# Patient Record
Sex: Female | Born: 1950 | Race: White | Hispanic: No | Marital: Married | Smoking: Former smoker
Health system: Southern US, Community
[De-identification: ages and names within clinical notes are randomized; demographics above are authoritative.]

## PROBLEM LIST (undated history)

## (undated) DIAGNOSIS — R011 Cardiac murmur, unspecified: Secondary | ICD-10-CM

## (undated) DIAGNOSIS — K760 Fatty (change of) liver, not elsewhere classified: Secondary | ICD-10-CM

## (undated) DIAGNOSIS — M797 Fibromyalgia: Secondary | ICD-10-CM

## (undated) DIAGNOSIS — D649 Anemia, unspecified: Secondary | ICD-10-CM

## (undated) DIAGNOSIS — M1990 Unspecified osteoarthritis, unspecified site: Secondary | ICD-10-CM

## (undated) DIAGNOSIS — R519 Headache, unspecified: Secondary | ICD-10-CM

## (undated) DIAGNOSIS — E119 Type 2 diabetes mellitus without complications: Secondary | ICD-10-CM

## (undated) DIAGNOSIS — K219 Gastro-esophageal reflux disease without esophagitis: Secondary | ICD-10-CM

## (undated) HISTORY — PX: TONSILLECTOMY: SUR1361

---
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
## 1981-08-30 HISTORY — PX: TUBAL LIGATION: SHX77

## 2008-08-30 DIAGNOSIS — C801 Malignant (primary) neoplasm, unspecified: Secondary | ICD-10-CM

## 2008-08-30 HISTORY — DX: Malignant (primary) neoplasm, unspecified: C80.1

## 2008-08-30 HISTORY — PX: MASTECTOMY: SHX3

## 2010-05-22 ENCOUNTER — Encounter: Admission: RE | Admit: 2010-05-22 | Discharge: 2010-06-03 | Payer: Self-pay | Admitting: Internal Medicine

## 2010-06-03 ENCOUNTER — Encounter
Admission: RE | Admit: 2010-06-03 | Discharge: 2010-08-25 | Payer: Self-pay | Source: Home / Self Care | Attending: Internal Medicine | Admitting: Internal Medicine

## 2014-03-07 DIAGNOSIS — F418 Other specified anxiety disorders: Secondary | ICD-10-CM | POA: Insufficient documentation

## 2015-08-31 HISTORY — PX: EYE SURGERY: SHX253

## 2018-04-10 DIAGNOSIS — M47812 Spondylosis without myelopathy or radiculopathy, cervical region: Secondary | ICD-10-CM | POA: Insufficient documentation

## 2019-07-17 ENCOUNTER — Other Ambulatory Visit: Payer: Self-pay | Admitting: Neurological Surgery

## 2019-08-13 NOTE — Pre-Procedure Instructions (Signed)
Cameron Dolle  08/13/2019      [facility], [place] [place] [address] Phone: [phone] Fax: [phone]    Your procedure is scheduled on August 17, 2019.  Report to [facility] Entrance "A" at 530 AM.  Call this number if you have problems the morning of surgery:  [phone]   Call [phone] if you have any questions prior to your surgery date Monday-Friday 8am-4pm    Remember:  Do not eat or drink after midnight.    Take these medicines the morning of surgery with A SIP OF WATER  Atenolol (Tenormin) Escitalopram (lexapro) Gabapentin (neurontin) Pantoprazole (protonix)  Beginning now, STOP taking any Aspirin (unless otherwise instructed by your surgeon), Aleve, Naproxen, Ibuprofen, Motrin, Advil, Goody's, BC's, all herbal medications, fish oil, and all vitamins.   WHAT DO I DO ABOUT MY DIABETES MEDICATION?  Marland Kitchen Do not take oral diabetes medicines (pills) the morning of surgery- glimepiride (Amaryl) or metformin (Glucophage).  Do not take take afternoon/evening dose of glimepiride (amaryl) the day before surgery. Take morning dose only  Reviewed and Endorsed by [facility] Patient Education Committee, August 2015  How to Manage Your Diabetes Before and After Surgery  Why is it important to control my blood sugar before and after surgery? . Improving blood sugar levels before and after surgery helps healing and can limit problems. . A way of improving blood sugar control is eating a healthy diet by: o  Eating less sugar and carbohydrates o  Increasing activity/exercise o  Talking with your doctor about reaching your blood sugar goals . High blood sugars (greater than 180 mg/dL) can raise your risk of infections and slow your recovery, so you will need to focus on controlling your diabetes during the weeks before surgery. . Make sure that the doctor who takes care of your diabetes knows  about your planned surgery including the date and location.  How do I manage my blood sugar before surgery? . Check your blood sugar at least 4 times a day, starting 2 days before surgery, to make sure that the level is not too high or low. o Check your blood sugar the morning of your surgery when you wake up and every 2 hours until you get to the Short Stay unit. . If your blood sugar is less than 70 mg/dL, you will need to treat for low blood sugar: o Do not take insulin. o Treat a low blood sugar (less than 70 mg/dL) with  cup of clear juice (cranberry or apple), 4 glucose tablets, OR glucose gel. Recheck blood sugar in 15 minutes after treatment (to make sure it is greater than 70 mg/dL). If your blood sugar is not greater than 70 mg/dL on recheck, call [phone] o  for further instructions. . Report your blood sugar to the short stay nurse when you get to Short Stay.  . If you are admitted to the hospital after surgery: o Your blood sugar will be checked by the staff and you will probably be given insulin after surgery (instead of oral diabetes medicines) to make sure you have good blood sugar levels. o The goal for blood sugar control after surgery is 80-180 mg/dL.   [facility]- Preparing For Surgery  Before surgery, you can play an important role. Because skin is not sterile, your skin needs to be as free of germs as possible. You can reduce the number of germs on your  skin by washing with CHG (chlorahexidine gluconate) Soap before surgery.  CHG is an antiseptic cleaner which kills germs and bonds with the skin to continue killing germs even after washing.    Oral Hygiene is also important to reduce your risk of infection.  Remember - BRUSH YOUR TEETH THE MORNING OF SURGERY WITH YOUR REGULAR TOOTHPASTE  Please do not use if you have an allergy to CHG or antibacterial soaps. If your skin becomes reddened/irritated stop using the CHG.  Do not shave (including legs and underarms) for  at least 48 hours prior to first CHG shower. It is OK to shave your face.  Please follow these instructions carefully.   1. Shower the NIGHT BEFORE SURGERY and the MORNING OF SURGERY with CHG.   2. If you chose to wash your hair, wash your hair first as usual with your normal shampoo.  3. After you shampoo, rinse your hair and body thoroughly to remove the shampoo.  4. Use CHG as you would any other liquid soap. You can apply CHG directly to the skin and wash gently with a scrungie or a clean washcloth.   5. Apply the CHG Soap to your body ONLY FROM THE NECK DOWN.  Do not use on open wounds or open sores. Avoid contact with your eyes, ears, mouth and genitals (private parts). Wash Face and genitals (private parts)  with your normal soap.  6. Wash thoroughly, paying special attention to the area where your surgery will be performed.  7. Thoroughly rinse your body with warm water from the neck down.  8. DO NOT shower/wash with your normal soap after using and rinsing off the CHG Soap.  9. Pat yourself dry with a CLEAN TOWEL.  10. Wear CLEAN PAJAMAS to bed the night before surgery, wear comfortable clothes the morning of surgery  11. Place CLEAN SHEETS on your bed the night of your first shower and DO NOT SLEEP WITH PETS.  Day of Surgery: Shower as above Do not apply any deodorants/lotions.  Please wear clean clothes to the hospital/surgery center.   Remember to brush your teeth WITH YOUR REGULAR TOOTHPASTE.    Do not wear jewelry, make-up or nail polish.  Do not wear lotions, powders, or perfumes, or deodorant.  Do not shave 48 hours prior to surgery.    Do not bring valuables to the hospital.  [facility] is not responsible for any belongings or valuables.  IF you are a smoker, DO NOT Smoke 24 hours prior to surgery   IF you wear a CPAP at night please bring your mask, tubing, and machine the morning of surgery    Remember that you must have someone to transport you home  after your surgery, and remain with you for 24 hours if you are discharged the same day. Contacts, dentures or bridgework may not be worn into surgery.  Leave your suitcase in the car.  After surgery it may be brought to your room.  For patients admitted to the hospital, discharge time will be determined by your treatment team.  Patients discharged the day of surgery will not be allowed to drive home.   Please read over the following fact sheets that you were given.

## 2019-08-14 ENCOUNTER — Encounter (HOSPITAL_COMMUNITY)
Admission: RE | Admit: 2019-08-14 | Discharge: 2019-08-14 | Disposition: A | Discharge status: Home / Self Care | Payer: Medicare Other | Source: Ambulatory Visit | Attending: Neurological Surgery | Admitting: Neurological Surgery

## 2019-08-14 ENCOUNTER — Ambulatory Visit (HOSPITAL_COMMUNITY)
Admission: RE | Admit: 2019-08-14 | Discharge: 2019-08-14 | Disposition: A | Discharge status: Home / Self Care | Payer: Medicare Other | Source: Ambulatory Visit | Attending: Neurological Surgery | Admitting: Neurological Surgery

## 2019-08-14 ENCOUNTER — Encounter (HOSPITAL_COMMUNITY): Payer: Self-pay

## 2019-08-14 ENCOUNTER — Other Ambulatory Visit (HOSPITAL_COMMUNITY)
Admission: RE | Admit: 2019-08-14 | Discharge: 2019-08-14 | Disposition: A | Discharge status: Home / Self Care | Payer: Medicare Other | Source: Ambulatory Visit | Attending: Neurological Surgery | Admitting: Neurological Surgery

## 2019-08-14 ENCOUNTER — Other Ambulatory Visit: Payer: Self-pay

## 2019-08-14 ENCOUNTER — Encounter (HOSPITAL_COMMUNITY): Payer: Self-pay | Admitting: Physician Assistant

## 2019-08-14 DIAGNOSIS — Z01818 Encounter for other preprocedural examination: Secondary | ICD-10-CM | POA: Diagnosis not present

## 2019-08-14 DIAGNOSIS — U071 COVID-19: Secondary | ICD-10-CM | POA: Diagnosis not present

## 2019-08-14 DIAGNOSIS — Z01812 Encounter for preprocedural laboratory examination: Secondary | ICD-10-CM | POA: Diagnosis not present

## 2019-08-14 DIAGNOSIS — Z01810 Encounter for preprocedural cardiovascular examination: Secondary | ICD-10-CM | POA: Insufficient documentation

## 2019-08-14 DIAGNOSIS — M4310 Spondylolisthesis, site unspecified: Secondary | ICD-10-CM | POA: Insufficient documentation

## 2019-08-14 HISTORY — DX: Cardiac murmur, unspecified: R01.1

## 2019-08-14 HISTORY — DX: Type 2 diabetes mellitus without complications: E11.9

## 2019-08-14 HISTORY — DX: Unspecified osteoarthritis, unspecified site: M19.90

## 2019-08-14 HISTORY — DX: Gastro-esophageal reflux disease without esophagitis: K21.9

## 2019-08-14 HISTORY — DX: Fibromyalgia: M79.7

## 2019-08-14 LAB — HEMOGLOBIN A1C
Hgb A1c MFr Bld: 7 % — ABNORMAL HIGH (ref 4.8–5.6)
Mean Plasma Glucose: 154.2 mg/dL

## 2019-08-14 LAB — CBC WITH DIFFERENTIAL/PLATELET
Abs Immature Granulocytes: 0.03 10*3/uL (ref 0.00–0.07)
Basophils Absolute: 0 10*3/uL (ref 0.0–0.1)
Basophils Relative: 1 %
Eosinophils Absolute: 0.1 10*3/uL (ref 0.0–0.5)
Eosinophils Relative: 4 %
HCT: 44.4 % (ref 36.0–46.0)
Hemoglobin: 13.8 g/dL (ref 12.0–15.0)
Immature Granulocytes: 1 %
Lymphocytes Relative: 36 %
Lymphs Abs: 1.3 10*3/uL (ref 0.7–4.0)
MCH: 29.6 pg (ref 26.0–34.0)
MCHC: 31.1 g/dL (ref 30.0–36.0)
MCV: 95.1 fL (ref 80.0–100.0)
Monocytes Absolute: 0.6 10*3/uL (ref 0.1–1.0)
Monocytes Relative: 15 %
Neutro Abs: 1.6 10*3/uL — ABNORMAL LOW (ref 1.7–7.7)
Neutrophils Relative %: 43 %
Platelets: 214 10*3/uL (ref 150–400)
RBC: 4.67 MIL/uL (ref 3.87–5.11)
RDW: 14 % (ref 11.5–15.5)
WBC: 3.7 10*3/uL — ABNORMAL LOW (ref 4.0–10.5)
nRBC: 0 % (ref 0.0–0.2)

## 2019-08-14 LAB — BASIC METABOLIC PANEL
Anion gap: 9 (ref 5–15)
BUN: 7 mg/dL — ABNORMAL LOW (ref 8–23)
CO2: 25 mmol/L (ref 22–32)
Calcium: 9.2 mg/dL (ref 8.9–10.3)
Chloride: 104 mmol/L (ref 98–111)
Creatinine, Ser: 0.64 mg/dL (ref 0.44–1.00)
GFR calc Af Amer: >60 mL/min (ref >60)
GFR calc non Af Amer: >60 mL/min (ref >60)
Glucose, Bld: 52 mg/dL — ABNORMAL LOW (ref 70–99)
Potassium: 3.7 mmol/L (ref 3.5–5.1)
Sodium: 138 mmol/L (ref 135–145)

## 2019-08-14 LAB — PROTIME-INR
INR: 1 (ref 0.8–1.2)
Prothrombin Time: 13 seconds (ref 11.4–15.2)

## 2019-08-14 LAB — TYPE AND SCREEN
ABO/RH(D): O NEG
Antibody Screen: NEGATIVE

## 2019-08-14 LAB — SURGICAL PCR SCREEN
MRSA, PCR: NEGATIVE
Staphylococcus aureus: NEGATIVE

## 2019-08-14 LAB — GLUCOSE, CAPILLARY: Glucose-Capillary: 71 mg/dL (ref 70–99)

## 2019-08-14 LAB — ABO/RH: ABO/RH(D): O NEG

## 2019-08-14 NOTE — Progress Notes (Signed)
PCP - Elenor Quinones, MD Cardiologist - denies  Chest x-ray - 08/14/19 EKG - 08/14/19 Stress Test - 10/29/15 ECHO - denies Cardiac Cath - denies  Fasting Blood Sugar - 70-90s Checks Blood Sugar _____ times a day  Blood Thinner Instructions: N/A Aspirin Instructions: N/A  COVID TEST- scheduled after PAT today, 08/14/19  Coronavirus Screening  Have you experienced the following symptoms:  Cough yes/no: No Fever (>100.65F)  yes/no: No Runny nose yes/no: No Sore throat yes/no: No Difficulty breathing/shortness of breath  yes/no: No  Have you or a family member traveled in the last 14 days and where? yes/no: No  If the patient indicates "YES" to the above questions, their PAT will be rescheduled to limit the exposure to others and, the surgeon will be notified. THE PATIENT WILL NEED TO BE ASYMPTOMATIC FOR 14 DAYS.   If the patient is not experiencing any of these symptoms, the PAT nurse will instruct them to NOT bring anyone with them to their appointment since they may have these symptoms or traveled as well.   Please remind your patients and families that hospital visitation restrictions are in effect and the importance of the restrictions.   Anesthesia review: Yes; hx of heart murmur as a child, does not see a cardiologist; cardiac stress test in 2017; hx Breast CA  Patient denies shortness of breath, fever, cough and chest pain at PAT appointment   All instructions explained to the patient, with a verbal understanding of the material. Patient agrees to go over the instructions while at home for a better understanding. Patient also instructed to self quarantine after being tested for COVID-19. The opportunity to ask questions was provided.

## 2019-08-15 LAB — NOVEL CORONAVIRUS, NAA (HOSP ORDER, SEND-OUT TO REF LAB; TAT 18-24 HRS): SARS-CoV-2, NAA: DETECTED — AB

## 2019-08-15 NOTE — Anesthesia Preprocedure Evaluation (Deleted)
Anesthesia Evaluation    Airway        Dental   Pulmonary former smoker,           Cardiovascular      Neuro/Psych    GI/Hepatic   Endo/Other  diabetes  Renal/GU      Musculoskeletal   Abdominal   Peds  Hematology   Anesthesia Other Findings   Reproductive/Obstetrics                             Anesthesia Physical Anesthesia Plan  ASA:   Anesthesia Plan:    Post-op Pain Management:    Induction:   PONV Risk Score and Plan:   Airway Management Planned:   Additional Equipment:   Intra-op Plan:   Post-operative Plan:   Informed Consent:   Plan Discussed with:   Anesthesia Plan Comments: (Pt evaluated by cardiology at [facility] in 2017 for precordial pain. She had an exercise stress test that was normal. Pain felt to be possibly by GI related.   Pt reported history of childhood murmur, denies any cardiac hx, denies hx of CP, DOE, CAD. On exam heart is RRR no M/R/G. Lungs CTAB.   Preop labs reviewed, WNL. DMII well controlled A1c 7.0.  EKG 08/14/19: Normal sinus rhythm. Rate 84. Left axis deviation. Incomplete right bundle branch block  Exercise stress test 10/29/15: CONCLUSION: 1. No ECG changes to suggest ischemia during Exercise treadmill stress test 2. Exercise capacity 9.5 METs. 3. No chest pain reported. 4. Appropriate hemodynamic response to exercise)      Anesthesia Quick Evaluation

## 2019-08-15 NOTE — Progress Notes (Signed)
Anesthesia Chart Review: Pt evaluated by cardiology at [facility] in 2017 for precordial pain. She had an exercise stress test that was normal. Pain felt to be possibly by GI related.   Pt reported history of childhood murmur, denies any cardiac hx, denies hx of CP, DOE, CAD. On exam heart is RRR no M/R/G. Lungs CTAB.   Preop labs reviewed, WNL. DMII well controlled A1c 7.0.  EKG 08/14/19: Normal sinus rhythm. Rate 84. Left axis deviation. Incomplete right bundle branch block  Exercise stress test 10/29/15: CONCLUSION: 1. No ECG changes to suggest ischemia during Exercise treadmill stress test 2. Exercise capacity 9.5 METs. 3. No chest pain reported. 4. Appropriate hemodynamic response to exercise   Wynonia Musty [facility] Short Stay Center/Anesthesiology Phone [phone] 08/15/2019 10:45 AM

## 2019-08-16 ENCOUNTER — Other Ambulatory Visit: Payer: Self-pay | Admitting: Nurse Practitioner

## 2019-08-16 DIAGNOSIS — E119 Type 2 diabetes mellitus without complications: Secondary | ICD-10-CM

## 2019-08-16 DIAGNOSIS — U071 COVID-19: Secondary | ICD-10-CM

## 2019-08-16 NOTE — Progress Notes (Signed)
  I connected by phone with Margaret Moody on 08/16/2019 at 3:25 PM to discuss the potential use of an new treatment for mild to moderate COVID-19 viral infection in non-hospitalized patients.  This patient is a 68 y.o. female that meets the FDA criteria for Emergency Use Authorization of bamlanivimab or casirivimab\imdevimab.  Has a (+) direct SARS-CoV-2 viral test result  Has mild or moderate COVID-19   Is ? 66 years of age and weighs ? 40 kg  Is NOT hospitalized due to COVID-19  Is NOT requiring oxygen therapy or requiring an increase in baseline oxygen flow rate due to COVID-19  Is within 10 days of symptom onset  Has at least one of the high risk factor(s) for progression to severe COVID-19 and/or hospitalization as defined in EUA.  Specific high risk criteria : Diabetes Patient is over the age of 31 and currently being managed for diabetes.   I have spoken and communicated the following to the patient or parent/caregiver:  1. FDA has authorized the emergency use of bamlanivimab and casirivimab\imdevimab for the treatment of mild to moderate COVID-19 in adults and pediatric patients with positive results of direct SARS-CoV-2 viral testing who are 91 years of age and older weighing at least 40 kg, and who are at high risk for progressing to severe COVID-19 and/or hospitalization.  2. The significant known and potential risks and benefits of bamlanivimab and casirivimab\imdevimab, and the extent to which such potential risks and benefits are unknown.  3. Information on available alternative treatments and the risks and benefits of those alternatives, including clinical trials.  4. Patients treated with bamlanivimab and casirivimab\imdevimab should continue to self-isolate and use infection control measures (e.g., wear mask, isolate, social distance, avoid sharing personal items, clean and disinfect "high touch" surfaces, and frequent handwashing) according to CDC guidelines.   5. The  patient or parent/caregiver has the option to accept or refuse bamlanivimab or casirivimab\imdevimab .  After reviewing this information with the patient, The patient agreed to proceed with receiving the bamlanimivab infusion and will be provided a copy of the Fact sheet prior to receiving the infusion.Fenton Foy 08/16/2019 3:25 PM

## 2019-08-16 NOTE — Progress Notes (Signed)
Dr. Ronnald Ramp aware of patient's covid test results and states he will contact patient.

## 2019-08-17 ENCOUNTER — Inpatient Hospital Stay (HOSPITAL_COMMUNITY): Admission: RE | Admit: 2019-08-17 | Payer: Medicare Other | Source: Home / Self Care | Admitting: Neurological Surgery

## 2019-08-17 ENCOUNTER — Encounter (HOSPITAL_COMMUNITY): Admission: RE | Payer: Self-pay | Source: Home / Self Care

## 2019-08-17 SURGERY — POSTERIOR LUMBAR FUSION 1 LEVEL
Anesthesia: General | Site: Back

## 2019-08-20 ENCOUNTER — Ambulatory Visit (HOSPITAL_COMMUNITY)
Admission: RE | Admit: 2019-08-20 | Discharge: 2019-08-20 | Disposition: A | Discharge status: Home / Self Care | Payer: Medicare Other | Source: Ambulatory Visit | Attending: Pulmonary Disease | Admitting: Pulmonary Disease

## 2019-08-20 DIAGNOSIS — Z23 Encounter for immunization: Secondary | ICD-10-CM | POA: Diagnosis present

## 2019-08-20 DIAGNOSIS — E119 Type 2 diabetes mellitus without complications: Secondary | ICD-10-CM

## 2019-08-20 DIAGNOSIS — U071 COVID-19: Secondary | ICD-10-CM | POA: Insufficient documentation

## 2019-08-20 MED ORDER — FAMOTIDINE IN NACL 20-0.9 MG/50ML-% IV SOLN: 20.0000 mg | Freq: Once | INTRAVENOUS | Status: DC | PRN

## 2019-08-20 MED ORDER — ALBUTEROL SULFATE HFA 108 (90 BASE) MCG/ACT IN AERS: 2.0000 | INHALATION_SPRAY | Freq: Once | RESPIRATORY_TRACT | Status: DC | PRN

## 2019-08-20 MED ORDER — METHYLPREDNISOLONE SODIUM SUCC 125 MG IJ SOLR: 125.0000 mg | Freq: Once | INTRAMUSCULAR | Status: DC | PRN

## 2019-08-20 MED ORDER — DIPHENHYDRAMINE HCL 50 MG/ML IJ SOLN: 50.0000 mg | Freq: Once | INTRAMUSCULAR | Status: DC | PRN

## 2019-08-20 MED ORDER — SODIUM CHLORIDE 0.9 % IV SOLN: INTRAVENOUS | Status: DC | PRN

## 2019-08-20 MED ORDER — EPINEPHRINE 0.3 MG/0.3ML IJ SOAJ: 0.3000 mg | Freq: Once | INTRAMUSCULAR | Status: DC | PRN

## 2019-08-20 MED ORDER — SODIUM CHLORIDE 0.9 % IV SOLN
700.0000 mg | Freq: Once | INTRAVENOUS | Status: AC
  Administered 2019-08-20: 700 mg via INTRAVENOUS
  Filled 2019-08-20: qty 20

## 2019-08-20 NOTE — Progress Notes (Addendum)
  Diagnosis: COVID-19  Physician: Dr. Leonides Cave  Procedure: Covid Infusion Clinic Med: bamlanivimab infusion - Provided patient with bamlanimivab fact sheet for patients, parents and caregivers prior to infusion.  Complications: No immediate complications noted.  Discharge: Discharged home   Margaret Moody N Cresta Riden 08/20/2019

## 2019-08-20 NOTE — Discharge Instructions (Signed)
Prevent the Spread of COVID-19 if You Are Sick If you are sick with COVID-19 or think you might have COVID-19, follow the steps below to help protect other people in your home and community. Stay home except to get medical care.  Stay home. Most people with COVID-19 have mild illness and are able to recover at home without medical care. Do not leave your home, except to get medical care. Do not visit public areas.  Take care of yourself. Get rest and stay hydrated.  Get medical care when needed. Call your doctor before you go to their office for care. But, if you have trouble breathing or other concerning symptoms, call 911 for immediate help.  Avoid public transportation, ride-sharing, or taxis. Separate yourself from other people and pets in your home.  As much as possible, stay in a specific room and away from other people and pets in your home. Also, you should use a separate bathroom, if available. If you need to be around other people or animals in or outside of the home, wear a cloth face covering. ? See COVID-19 and Animals if you have questions about pets: [email or web address].thomas.biz/ Monitor your symptoms.  Common symptoms of COVID-19 include fever and cough. Trouble breathing is a more serious symptom that means you should get medical attention.  Follow care instructions from your healthcare provider and local health department. Your local health authorities will give instructions on checking your symptoms and reporting information. If you develop emergency warning signs for COVID-19 get medical attention immediately.  Emergency warning signs include*:  Trouble breathing  Persistent pain or pressure in the chest  New confusion or not able to be woken  Bluish lips or face *This list is not all inclusive. Please consult your medical provider for any other symptoms that are severe or concerning to you. Call 911 if you have a medical  emergency. If you have a medical emergency and need to call 911, notify the operator that you have or think you might have, COVID-19. If possible, put on a facemask before medical help arrives. Call ahead before visiting your doctor.  Call ahead. Many medical visits for routine care are being postponed or done by phone or telemedicine.  If you have a medical appointment that cannot be postponed, call your doctor's office. This will help the office protect themselves and other patients. If you are sick, wear a cloth covering over your nose and mouth.  You should wear a cloth face covering over your nose and mouth if you must be around other people or animals, including pets (even at home).  You don't need to wear the cloth face covering if you are alone. If you can't put on a cloth face covering (because of trouble breathing for example), cover your coughs and sneezes in some other way. Try to stay at least 6 feet away from other people. This will help protect the people around you. Note: During the COVID-19 pandemic, medical grade facemasks are reserved for healthcare workers and some first responders. You may need to make a cloth face covering using a scarf or bandana. Cover your coughs and sneezes.  Cover your mouth and nose with a tissue when you cough or sneeze.  Throw used tissues in a lined trash can.  Immediately wash your hands with soap and water for at least 20 seconds. If soap and water are not available, clean your hands with an alcohol-based hand sanitizer that contains at least 60% alcohol. Clean your hands often.  Wash your hands often with soap and water for at least 20 seconds. This is especially important after blowing your nose, coughing, or sneezing; going to the bathroom; and before eating or preparing food.  Use hand sanitizer if soap and water are not available. Use an alcohol-based hand sanitizer with at least 60% alcohol, covering all surfaces of your hands and rubbing  them together until they feel dry.  Soap and water are the best option, especially if your hands are visibly dirty.  Avoid touching your eyes, nose, and mouth with unwashed hands. Avoid sharing personal household items.  Do not share dishes, drinking glasses, cups, eating utensils, towels, or bedding with other people in your home.  Wash these items thoroughly after using them with soap and water or put them in the dishwasher. Clean all "high-touch" surfaces everyday.  Clean and disinfect high-touch surfaces in your "sick room" and bathroom. Let someone else clean and disinfect surfaces in common areas, but not your bedroom and bathroom.  If a caregiver or other person needs to clean and disinfect a sick person's bedroom or bathroom, they should do so on an as-needed basis. The caregiver/other person should wear a mask and wait as long as possible after the sick person has used the bathroom. High-touch surfaces include phones, remote controls, counters, tabletops, doorknobs, bathroom fixtures, toilets, keyboards, tablets, and bedside tables.  Clean and disinfect areas that may have blood, stool, or body fluids on them.  Use household cleaners and disinfectants. Clean the area or item with soap and water or another detergent if it is dirty. Then use a household disinfectant. ? Be sure to follow the instructions on the label to ensure safe and effective use of the product. Many products recommend keeping the surface wet for several minutes to ensure germs are killed. Many also recommend precautions such as wearing gloves and making sure you have good ventilation during use of the product. ? Most EPA-registered household disinfectants should be effective. How to discontinue home isolation  People with COVID-19 who have stayed home (home isolated) can stop home isolation under the following conditions: ? If you will not have a test to determine if you are still contagious, you can leave home  after these three things have happened:  You have had no fever for at least 72 hours (that is three full days of no fever without the use of medicine that reduces fevers) AND  other symptoms have improved (for example, when your cough or shortness of breath has improved) AND  at least 10 days have passed since your symptoms first appeared. ? If you will be tested to determine if you are still contagious, you can leave home after these three things have happened:  You no longer have a fever (without the use of medicine that reduces fevers) AND  other symptoms have improved (for example, when your cough or shortness of breath has improved) AND  you received two negative tests in a row, 24 hours apart. Your doctor will follow CDC guidelines. In all cases, follow the guidance of your healthcare provider and local health department. The decision to stop home isolation should be made in consultation with your healthcare provider and state and local health departments. Local decisions depend on local circumstances. cdc.gov/coronavirus 12/31/2018 This information is not intended to replace advice given to you by your health care provider. Make sure you discuss any questions you have with your health care provider. Document Released: 12/12/2018 Document Revised: 01/10/2019 Document Reviewed: 12/12/2018   Elsevier Patient Education  [employer]. COVID-19 COVID-19 is a respiratory infection that is caused by a virus called severe acute respiratory syndrome coronavirus 2 (SARS-CoV-2). The disease is also known as coronavirus disease or novel coronavirus. In some people, the virus may not cause any symptoms. In others, it may cause a serious infection. The infection can get worse quickly and can lead to complications, such as:  Pneumonia, or infection of the lungs.  Acute respiratory distress syndrome or ARDS. This is fluid build-up in the lungs.  Acute respiratory failure. This is a condition in  which there is not enough oxygen passing from the lungs to the body.  Sepsis or septic shock. This is a serious bodily reaction to an infection.  Blood clotting problems.  Secondary infections due to bacteria or fungus. The virus that causes COVID-19 is contagious. This means that it can spread from person to person through droplets from coughs and sneezes (respiratory secretions). What are the causes? This illness is caused by a virus. You may catch the virus by:  Breathing in droplets from an infected person's cough or sneeze.  Touching something, like a table or a doorknob, that was exposed to the virus (contaminated) and then touching your mouth, nose, or eyes. What increases the risk? Risk for infection You are more likely to be infected with this virus if you:  Live in or travel to an area with a COVID-19 outbreak.  Come in contact with a sick person who recently traveled to an area with a COVID-19 outbreak.  Provide care for or live with a person who is infected with COVID-19. Risk for serious illness You are more likely to become seriously ill from the virus if you:  Are 10 years of age or older.  Have a long-term disease that lowers your body's ability to fight infection (immunocompromised).  Live in a nursing home or long-term care facility.  Have a long-term (chronic) disease such as: ? Chronic lung disease, including chronic obstructive pulmonary disease or asthma ? Heart disease. ? Diabetes. ? Chronic kidney disease. ? Liver disease.  Are obese. What are the signs or symptoms? Symptoms of this condition can range from mild to severe. Symptoms may appear any time from 2 to 14 days after being exposed to the virus. They include:  A fever.  A cough.  Difficulty breathing.  Chills.  Muscle pains.  A sore throat.  Loss of taste or smell. Some people may also have stomach problems, such as nausea, vomiting, or diarrhea. Other people may not have any  symptoms of COVID-19. How is this diagnosed? This condition may be diagnosed based on:  Your signs and symptoms, especially if: ? You live in an area with a COVID-19 outbreak. ? You recently traveled to or from an area where the virus is common. ? You provide care for or live with a person who was diagnosed with COVID-19.  A physical exam.  Lab tests, which may include: ? A nasal swab to take a sample of fluid from your nose. ? A throat swab to take a sample of fluid from your throat. ? A sample of mucus from your lungs (sputum). ? Blood tests.  Imaging tests, which may include, X-rays, CT scan, or ultrasound. How is this treated? At present, there is no medicine to treat COVID-19. Medicines that treat other diseases are being used on a trial basis to see if they are effective against COVID-19. Your health care provider will talk with you  about ways to treat your symptoms. For most people, the infection is mild and can be managed at home with rest, fluids, and over-the-counter medicines. Treatment for a serious infection usually takes places in a hospital intensive care unit (ICU). It may include one or more of the following treatments. These treatments are given until your symptoms improve.  Receiving fluids and medicines through an IV.  Supplemental oxygen. Extra oxygen is given through a tube in the nose, a face mask, or a hood.  Positioning you to lie on your stomach (prone position). This makes it easier for oxygen to get into the lungs.  Continuous positive airway pressure (CPAP) or bi-level positive airway pressure (BPAP) machine. This treatment uses mild air pressure to keep the airways open. A tube that is connected to a motor delivers oxygen to the body.  Ventilator. This treatment moves air into and out of the lungs by using a tube that is placed in your windpipe.  Tracheostomy. This is a procedure to create a hole in the neck so that a breathing tube can be  inserted.  Extracorporeal membrane oxygenation (ECMO). This procedure gives the lungs a chance to recover by taking over the functions of the heart and lungs. It supplies oxygen to the body and removes carbon dioxide. Follow these instructions at home: Lifestyle  If you are sick, stay home except to get medical care. Your health care provider will tell you how long to stay home. Call your health care provider before you go for medical care.  Rest at home as told by your health care provider.  Do not use any products that contain nicotine or tobacco, such as cigarettes, e-cigarettes, and chewing tobacco. If you need help quitting, ask your health care provider.  Return to your normal activities as told by your health care provider. Ask your health care provider what activities are safe for you. General instructions  Take over-the-counter and prescription medicines only as told by your health care provider.  Drink enough fluid to keep your urine pale yellow.  Keep all follow-up visits as told by your health care provider. This is important. How is this prevented?  There is no vaccine to help prevent COVID-19 infection. However, there are steps you can take to protect yourself and others from this virus. To protect yourself:   Do not travel to areas where COVID-19 is a risk. The areas where COVID-19 is reported change often. To identify high-risk areas and travel restrictions, check the CDC travel website: FatFares.com.br  If you live in, or must travel to, an area where COVID-19 is a risk, take precautions to avoid infection. ? Stay away from people who are sick. ? Wash your hands often with soap and water for 20 seconds. If soap and water are not available, use an alcohol-based hand sanitizer. ? Avoid touching your mouth, face, eyes, or nose. ? Avoid going out in public, follow guidance from your state and local health authorities. ? If you must go out in public, wear a  cloth face covering or face mask. ? Disinfect objects and surfaces that are frequently touched every day. This may include:  Counters and tables.  Doorknobs and light switches.  Sinks and faucets.  Electronics, such as phones, remote controls, keyboards, computers, and tablets. To protect others: If you have symptoms of COVID-19, take steps to prevent the virus from spreading to others.  If you think you have a COVID-19 infection, contact your health care provider right away. Tell your  health care team that you think you may have a COVID-19 infection.  Stay home. Leave your house only to seek medical care. Do not use public transport.  Do not travel while you are sick.  Wash your hands often with soap and water for 20 seconds. If soap and water are not available, use alcohol-based hand sanitizer.  Stay away from other members of your household. Let healthy household members care for children and pets, if possible. If you have to care for children or pets, wash your hands often and wear a mask. If possible, stay in your own room, separate from others. Use a different bathroom.  Make sure that all people in your household wash their hands well and often.  Cough or sneeze into a tissue or your sleeve or elbow. Do not cough or sneeze into your hand or into the air.  Wear a cloth face covering or face mask. Where to find more information  Centers for Disease Control and Prevention: PurpleGadgets.be  World Health Organization: https://www.castaneda.info/ Contact a health care provider if:  You live in or have traveled to an area where COVID-19 is a risk and you have symptoms of the infection.  You have had contact with someone who has COVID-19 and you have symptoms of the infection. Get help right away if:  You have trouble breathing.  You have pain or pressure in your chest.  You have confusion.  You have bluish lips and  fingernails.  You have difficulty waking from sleep.  You have symptoms that get worse. These symptoms may represent a serious problem that is an emergency. Do not wait to see if the symptoms will go away. Get medical help right away. Call your local emergency services (911 in the [place] Do not drive yourself to the hospital. Let the emergency medical personnel know if you think you have COVID-19. Summary  COVID-19 is a respiratory infection that is caused by a virus. It is also known as coronavirus disease or novel coronavirus. It can cause serious infections, such as pneumonia, acute respiratory distress syndrome, acute respiratory failure, or sepsis.  The virus that causes COVID-19 is contagious. This means that it can spread from person to person through droplets from coughs and sneezes.  You are more likely to develop a serious illness if you are 66 years of age or older, have a weak immunity, live in a nursing home, or have chronic disease.  There is no medicine to treat COVID-19. Your health care provider will talk with you about ways to treat your symptoms.  Take steps to protect yourself and others from infection. Wash your hands often and disinfect objects and surfaces that are frequently touched every day. Stay away from people who are sick and wear a mask if you are sick. This information is not intended to replace advice given to you by your health care provider. Make sure you discuss any questions you have with your health care provider. Document Released: 09/21/2018 Document Revised: 01/11/2019 Document Reviewed: 09/21/2018 Elsevier Patient Education  2020 [employer].   COVID-19 Frequently Asked Questions COVID-19 (coronavirus disease) is an infection that is caused by a large family of viruses. Some viruses cause illness in people and others cause illness in animals like camels, cats, and bats. In some cases, the viruses that cause illness in animals can spread to  humans. Where did the coronavirus come from? In December 2019, [place] told the [facility]) of several cases of lung disease (human  respiratory illness). These cases were linked to an open seafood and livestock market in the city of [place]. The link to the seafood and livestock market suggests that the virus may have spread from animals to humans. However, since that first outbreak in December, the virus has also been shown to spread from person to person. What is the name of the disease and the virus? Disease name Early on, this disease was called novel coronavirus. This is because scientists determined that the disease was caused by a new (novel) respiratory virus. The World Health Organization [facility]) has now named the disease COVID-19, or coronavirus disease. Virus name The virus that causes the disease is called severe acute respiratory syndrome coronavirus 2 (SARS-CoV-2). More information on disease and virus naming World Health Organization [facility]): www.who.int/emergencies/diseases/novel-coronavirus-2019/technical-guidance/naming-the-coronavirus-disease-(covid-2019)-and-the-virus-that-causes-it Who is at risk for complications from coronavirus disease? Some people may be at higher risk for complications from coronavirus disease. This includes older adults and people who have chronic diseases, such as heart disease, diabetes, and lung disease. If you are at higher risk for complications, take these extra precautions:  Avoid close contact with people who are sick or have a fever or cough. Stay at least 3-6 ft (1-2 m) away from them, if possible.  Wash your hands often with soap and water for at least 20 seconds.  Avoid touching your face, mouth, nose, or eyes.  Keep supplies on hand at home, such as food, medicine, and cleaning supplies.  Stay home as much as possible.  Avoid social gatherings and travel. How does coronavirus disease spread? The virus that causes  coronavirus disease spreads easily from person to person (is contagious). There are also cases of community-spread disease. This means the disease has spread to:  People who have no known contact with other infected people.  People who have not traveled to areas where there are known cases. It appears to spread from one person to another through droplets from coughing or sneezing. Can I get the virus from touching surfaces or objects? There is still a lot that we do not know about the virus that causes coronavirus disease. Scientists are basing a lot of information on what they know about similar viruses, such as:  Viruses cannot generally survive on surfaces for long. They need a human body (host) to survive.  It is more likely that the virus is spread by close contact with people who are sick (direct contact), such as through: ? Shaking hands or hugging. ? Breathing in respiratory droplets that travel through the air. This can happen when an infected person coughs or sneezes on or near other people.  It is less likely that the virus is spread when a person touches a surface or object that has the virus on it (indirect contact). The virus may be able to enter the body if the person touches a surface or object and then touches his or her face, eyes, nose, or mouth. Can a person spread the virus without having symptoms of the disease? It may be possible for the virus to spread before a person has symptoms of the disease, but this is most likely not the main way the virus is spreading. It is more likely for the virus to spread by being in close contact with people who are sick and breathing in the respiratory droplets of a sick person's cough or sneeze. What are the symptoms of coronavirus disease? Symptoms vary from person to person and can range from mild to severe. Symptoms may include:  Fever.  Cough.  Tiredness, weakness, or fatigue.  Fast breathing or feeling short of breath. These  symptoms can appear anywhere from 2 to 14 days after you have been exposed to the virus. If you develop symptoms, call your health care provider. People with severe symptoms may need hospital care. If I am exposed to the virus, how long does it take before symptoms start? Symptoms of coronavirus disease may appear anywhere from 2 to 14 days after a person has been exposed to the virus. If you develop symptoms, call your health care provider. Should I be tested for this virus? Your health care provider will decide whether to test you based on your symptoms, history of exposure, and your risk factors. How does a health care provider test for this virus? Health care providers will collect samples to send for testing. Samples may include:  Taking a swab of fluid from the nose.  Taking fluid from the lungs by having you cough up mucus (sputum) into a sterile cup.  Taking a blood sample.  Taking a stool or urine sample. Is there a treatment or vaccine for this virus? Currently, there is no vaccine to prevent coronavirus disease. Also, there are no medicines like antibiotics or antivirals to treat the virus. A person who becomes sick is given supportive care, which means rest and fluids. A person may also relieve his or her symptoms by using over-the-counter medicines that treat sneezing, coughing, and runny nose. These are the same medicines that a person takes for the common cold. If you develop symptoms, call your health care provider. People with severe symptoms may need hospital care. What can I do to protect myself and my family from this virus?     You can protect yourself and your family by taking the same actions that you would take to prevent the spread of other viruses. Take the following actions:  Wash your hands often with soap and water for at least 20 seconds. If soap and water are not available, use alcohol-based hand sanitizer.  Avoid touching your face, mouth, nose, or  eyes.  Cough or sneeze into a tissue, sleeve, or elbow. Do not cough or sneeze into your hand or the air. ? If you cough or sneeze into a tissue, throw it away immediately and wash your hands.  Disinfect objects and surfaces that you frequently touch every day.  Avoid close contact with people who are sick or have a fever or cough. Stay at least 3-6 ft (1-2 m) away from them, if possible.  Stay home if you are sick, except to get medical care. Call your health care provider before you get medical care.  Make sure your vaccines are up to date. Ask your health care provider what vaccines you need. What should I do if I need to travel? Follow travel recommendations from your local health authority, the CDC, and WHO. Travel information and advice  Centers for Disease Control and Prevention (CDC): BodyEditor.hu  World Health Organization [facility]): ThirdIncome.ca Know the risks and take action to protect your health  You are at higher risk of getting coronavirus disease if you are traveling to areas with an outbreak or if you are exposed to travelers from areas with an outbreak.  Wash your hands often and practice good hygiene to lower the risk of catching or spreading the virus. What should I do if I am sick? General instructions to stop the spread of infection  Wash your hands often with soap and  water for at least 20 seconds. If soap and water are not available, use alcohol-based hand sanitizer.  Cough or sneeze into a tissue, sleeve, or elbow. Do not cough or sneeze into your hand or the air.  If you cough or sneeze into a tissue, throw it away immediately and wash your hands.  Stay home unless you must get medical care. Call your health care provider or local health authority before you get medical care.  Avoid public areas. Do not take public transportation, if possible.  If you can, wear  a mask if you must go out of the house or if you are in close contact with someone who is not sick. Keep your home clean  Disinfect objects and surfaces that are frequently touched every day. This may include: ? Counters and tables. ? Doorknobs and light switches. ? Sinks and faucets. ? Electronics such as phones, remote controls, keyboards, computers, and tablets.  Wash dishes in hot, soapy water or use a dishwasher. Air-dry your dishes.  Wash laundry in hot water. Prevent infecting other household members  Let healthy household members care for children and pets, if possible. If you have to care for children or pets, wash your hands often and wear a mask.  Sleep in a different bedroom or bed, if possible.  Do not share personal items, such as razors, toothbrushes, deodorant, combs, brushes, towels, and washcloths. Where to find more information Centers for Disease Control and Prevention (CDC)  Information and news updates: https://www.butler-gonzalez.com/ World Health Organization [facility])  Information and news updates: MissExecutive.com.ee  Coronavirus health topic: https://www.castaneda.info/  Questions and answers on COVID-19: OpportunityDebt.at  Global tracker: who.sprinklr.com American Academy of Pediatrics (AAP)  Information for families: www.healthychildren.org/English/health-issues/conditions/chest-lungs/Pages/2019-Novel-Coronavirus.aspx The coronavirus situation is changing rapidly. Check your local health authority website or the CDC and [facility] websites for updates and news. When should I contact a health care provider?  Contact your health care provider if you have symptoms of an infection, such as fever or cough, and you: ? Have been near anyone who is known to have coronavirus disease. ? Have come into contact with a person who is suspected to have coronavirus disease. ? Have traveled  outside of the country. When should I get emergency medical care?  Get help right away by calling your local emergency services (911 in the [place] if you have: ? Trouble breathing. ? Pain or pressure in your chest. ? Confusion. ? Blue-tinged lips and fingernails. ? Difficulty waking from sleep. ? Symptoms that get worse. Let the emergency medical personnel know if you think you have coronavirus disease. Summary  A new respiratory virus is spreading from person to person and causing COVID-19 (coronavirus disease).  The virus that causes COVID-19 appears to spread easily. It spreads from one person to another through droplets from coughing or sneezing.  Older adults and those with chronic diseases are at higher risk of disease. If you are at higher risk for complications, take extra precautions.  There is currently no vaccine to prevent coronavirus disease. There are no medicines, such as antibiotics or antivirals, to treat the virus.  You can protect yourself and your family by washing your hands often, avoiding touching your face, and covering your coughs and sneezes. This information is not intended to replace advice given to you by your health care provider. Make sure you discuss any questions you have with your health care provider. Document Released: 12/12/2018 Document Revised: 12/12/2018 Document Reviewed: 12/12/2018 Elsevier Patient Education  [place].

## 2019-08-27 ENCOUNTER — Other Ambulatory Visit: Payer: Self-pay | Admitting: Neurological Surgery

## 2019-09-03 ENCOUNTER — Encounter (HOSPITAL_COMMUNITY): Payer: Self-pay

## 2019-09-03 NOTE — Pre-Procedure Instructions (Signed)
Jazalynn Currington  09/03/2019    Your procedure is scheduled on Friday, September 07, 2019 at 10:00 AM.   Report to [facility] Entrance "A" Admitting Office at 8:00 AM.   Call this number if you have problems the morning of surgery: [phone]   Questions prior to day of surgery, please call [phone] between 8 & 4 PM.   Remember:  Do not eat or drink after midnight Thursday, 09/06/19.  Take these medicines the morning of surgery with A SIP OF WATER: Atenolol (Tenormin), Escitalopram (Lexapro), Gabapentin (Neurontin), Pantoprazole (Protonix), Sumatriptan (Imitrex) - if needed  Do not take Glimepiride (Amaryl) the evening before surgery or the morning of surgery. Do not take Metformin the morning of surgery.   Stop Herbal medications as of today prior to surgery. Do not use Aspirin products, NSAIDS (Ibuprofen, Aleve, etc), Fish Oil or Multivitamins  prior to surgery.   How to Manage Your Diabetes Before Surgery   Why is it important to control my blood sugar before and after surgery?   Improving blood sugar levels before and after surgery helps healing and can limit problems.  A way of improving blood sugar control is eating a healthy diet by:  - Eating less sugar and carbohydrates  - Increasing activity/exercise  - Talk with your doctor about reaching your blood sugar goals  High blood sugars (greater than 180 mg/dL) can raise your risk of infections and slow down your recovery so you will need to focus on controlling your diabetes during the weeks before surgery.  Make sure that the doctor who takes care of your diabetes knows about your planned surgery including the date and location.  How do I manage my blood sugars before surgery?   Check your blood sugar at least 4 times a day, 2 days before surgery to make sure that they are not too high or low.  Check your blood sugar the morning of your surgery when you wake up and every 2 hours until you get to the Short-Stay  unit.  Treat a low blood sugar (less than 70 mg/dL) with 1/2 cup of clear juice (cranberry or apple), 4 glucose tablets, OR glucose gel.  Recheck blood sugar in 15 minutes after treatment (to make sure it is greater than 70 mg/dL).  If blood sugar is not greater than 70 mg/dL on re-check, call [phone] for further instructions.   Report your blood sugar to the Short-Stay nurse when you get to Short-Stay.  References:  [facility], 2007 "How to Manage your Diabetes Before and After Surgery".    Do not wear jewelry, make-up or nail polish.  Do not wear lotions, powders, perfumes or deodorant.  Do not shave 48 hours prior to surgery.    Do not bring valuables to the hospital.  [facility] is not responsible for any belongings or valuables.  Contacts, dentures or bridgework may not be worn into surgery.  Leave your suitcase in the car.  After surgery it may be brought to your room.  For patients admitted to the hospital, discharge time will be determined by your treatment team.  [facility] - Preparing for Surgery  Before surgery, you can play an important role.  Because skin is not sterile, your skin needs to be as free of germs as possible.  You can reduce the number of germs on you skin by washing with CHG (chlorahexidine gluconate) soap before surgery.  CHG is an antiseptic cleaner which kills germs and  bonds with the skin to continue killing germs even after washing.  Oral Hygiene is also important in reducing the risk of infection.  Remember to brush your teeth with your regular toothpaste the morning of surgery.  Please DO NOT use if you have an allergy to CHG or antibacterial soaps.  If your skin becomes reddened/irritated stop using the CHG and inform your nurse when you arrive at Short Stay.  Do not shave (including legs and underarms) for at least 48 hours prior to the first CHG shower.  You may shave your face.  Please follow these instructions  carefully:   1.  Shower with CHG Soap the night before surgery and the morning of Surgery.  2.  If you choose to wash your hair, wash your hair first as usual with your normal shampoo.  3.  After you shampoo, rinse your hair and body thoroughly to remove the shampoo. 4.  Use CHG as you would any other liquid soap.  You can apply chg directly to the skin and wash gently with a      scrungie or washcloth.           5.  Apply the CHG Soap to your body ONLY FROM THE NECK DOWN.   Do not use on open wounds or open sores. Avoid contact with your eyes, ears, mouth and genitals (private parts).  Wash genitals (private parts) with your normal soap - do this prior to using CHG soap.  6.  Wash thoroughly, paying special attention to the area where your surgery will be performed.  7.  Thoroughly rinse your body with warm water from the neck down.  8.  DO NOT shower/wash with your normal soap after using and rinsing off the CHG Soap.  9.  Pat yourself dry with a clean towel.            10.  Wear clean pajamas.            11.  Place clean sheets on your bed the night of your first shower and do not sleep with pets.  Day of Surgery  Shower as above. Do not apply any lotions/deodorants the morning of surgery.   Please wear clean clothes to the hospital. Remember to brush your teeth with toothpaste.   Please read over the fact sheets that you were given.

## 2019-09-04 ENCOUNTER — Encounter (HOSPITAL_COMMUNITY): Payer: Self-pay

## 2019-09-04 ENCOUNTER — Other Ambulatory Visit: Payer: Self-pay

## 2019-09-04 ENCOUNTER — Encounter (HOSPITAL_COMMUNITY)
Admission: RE | Admit: 2019-09-04 | Discharge: 2019-09-04 | Disposition: A | Discharge status: Home / Self Care | Payer: Medicare Other | Source: Ambulatory Visit | Attending: Neurological Surgery | Admitting: Neurological Surgery

## 2019-09-04 ENCOUNTER — Inpatient Hospital Stay (HOSPITAL_COMMUNITY): Admission: RE | Admit: 2019-09-04 | Payer: Medicare Other | Source: Ambulatory Visit

## 2019-09-04 DIAGNOSIS — M4317 Spondylolisthesis, lumbosacral region: Secondary | ICD-10-CM | POA: Insufficient documentation

## 2019-09-04 DIAGNOSIS — Z01812 Encounter for preprocedural laboratory examination: Secondary | ICD-10-CM | POA: Diagnosis not present

## 2019-09-04 HISTORY — DX: Anemia, unspecified: D64.9

## 2019-09-04 HISTORY — DX: Fatty (change of) liver, not elsewhere classified: K76.0

## 2019-09-04 HISTORY — DX: Headache, unspecified: R51.9

## 2019-09-04 LAB — TYPE AND SCREEN
ABO/RH(D): O NEG
Antibody Screen: NEGATIVE

## 2019-09-04 LAB — BASIC METABOLIC PANEL
Anion gap: 12 (ref 5–15)
BUN: 9 mg/dL (ref 8–23)
CO2: 25 mmol/L (ref 22–32)
Calcium: 8.7 mg/dL — ABNORMAL LOW (ref 8.9–10.3)
Chloride: 102 mmol/L (ref 98–111)
Creatinine, Ser: 0.71 mg/dL (ref 0.44–1.00)
GFR calc Af Amer: >60 mL/min (ref >60)
GFR calc non Af Amer: >60 mL/min (ref >60)
Glucose, Bld: 126 mg/dL — ABNORMAL HIGH (ref 70–99)
Potassium: 4 mmol/L (ref 3.5–5.1)
Sodium: 139 mmol/L (ref 135–145)

## 2019-09-04 LAB — CBC WITH DIFFERENTIAL/PLATELET
Abs Immature Granulocytes: 0.1 10*3/uL — ABNORMAL HIGH (ref 0.00–0.07)
Basophils Absolute: 0 10*3/uL (ref 0.0–0.1)
Basophils Relative: 1 %
Eosinophils Absolute: 0.2 10*3/uL (ref 0.0–0.5)
Eosinophils Relative: 3 %
HCT: 38.5 % (ref 36.0–46.0)
Hemoglobin: 11.9 g/dL — ABNORMAL LOW (ref 12.0–15.0)
Immature Granulocytes: 2 %
Lymphocytes Relative: 29 %
Lymphs Abs: 1.5 10*3/uL (ref 0.7–4.0)
MCH: 28.9 pg (ref 26.0–34.0)
MCHC: 30.9 g/dL (ref 30.0–36.0)
MCV: 93.4 fL (ref 80.0–100.0)
Monocytes Absolute: 0.6 10*3/uL (ref 0.1–1.0)
Monocytes Relative: 11 %
Neutro Abs: 2.8 10*3/uL (ref 1.7–7.7)
Neutrophils Relative %: 54 %
Platelets: 292 10*3/uL (ref 150–400)
RBC: 4.12 MIL/uL (ref 3.87–5.11)
RDW: 14.1 % (ref 11.5–15.5)
WBC: 5.2 10*3/uL (ref 4.0–10.5)
nRBC: 0 % (ref 0.0–0.2)

## 2019-09-04 LAB — GLUCOSE, CAPILLARY: Glucose-Capillary: 181 mg/dL — ABNORMAL HIGH (ref 70–99)

## 2019-09-04 NOTE — Progress Notes (Signed)
PCP - Dr.  Elenor Quinones Cardiologist - denies  Chest x-ray - 08/14/19 EKG - 08/14/19 Stress Test - denies ECHO - denies Cardiac Cath - denies  Sleep Study - denies CPAP -   Fasting Blood Sugar - 68-120 Checks Blood Sugar ___as needed__ times a day Pt's most recent A1C was 6.4 on 08/30/19  Blood Thinner Instructions: n/a Aspirin Instructions: n/a  COVID TEST- tested positive a month ago, does not need another one.   Anesthesia review: no  Patient denies shortness of breath, fever, cough and chest pain at PAT appointment   All instructions explained to the patient, with a verbal understanding of the material. Patient agrees to go over the instructions while at home for a better understanding. Patient also instructed to self quarantine after being tested for COVID-19. The opportunity to ask questions was provided.

## 2019-09-06 NOTE — Anesthesia Preprocedure Evaluation (Addendum)
Anesthesia Evaluation  Patient identified by MRN, date of birth, ID band Patient awake    Reviewed: Allergy & Precautions, NPO status , Patient's Chart, lab work & pertinent test results  Airway Mallampati: II  TM Distance: >3 FB     Dental   Pulmonary former smoker,    breath sounds clear to auscultation       Cardiovascular + Valvular Problems/Murmurs  Rhythm:Regular Rate:Normal     Neuro/Psych    GI/Hepatic Neg liver ROS, GERD  ,  Endo/Other  diabetes  Renal/GU negative Renal ROS     Musculoskeletal   Abdominal   Peds  Hematology   Anesthesia Other Findings   Reproductive/Obstetrics                            Anesthesia Physical Anesthesia Plan  ASA: III  Anesthesia Plan: General   Post-op Pain Management:    Induction: Intravenous  PONV Risk Score and Plan: 2 and Ondansetron and Midazolam  Airway Management Planned: Oral ETT  Additional Equipment:   Intra-op Plan:   Post-operative Plan: Possible Post-op intubation/ventilation  Informed Consent: I have reviewed the patients History and Physical, chart, labs and discussed the procedure including the risks, benefits and alternatives for the proposed anesthesia with the patient or authorized representative who has indicated his/her understanding and acceptance.     Dental advisory given  Plan Discussed with: Anesthesiologist and CRNA  Anesthesia Plan Comments:        Anesthesia Quick Evaluation

## 2019-09-07 ENCOUNTER — Inpatient Hospital Stay (HOSPITAL_COMMUNITY): Payer: Medicare Other | Admitting: Certified Registered Nurse Anesthetist

## 2019-09-07 ENCOUNTER — Other Ambulatory Visit: Payer: Self-pay

## 2019-09-07 ENCOUNTER — Observation Stay (HOSPITAL_COMMUNITY)
Admission: RE | Admit: 2019-09-07 | Discharge: 2019-09-08 | Disposition: A | Discharge status: Home / Self Care | Payer: Medicare Other | Attending: Neurological Surgery | Admitting: Neurological Surgery

## 2019-09-07 ENCOUNTER — Encounter (HOSPITAL_COMMUNITY): Payer: Self-pay | Admitting: Neurological Surgery

## 2019-09-07 ENCOUNTER — Inpatient Hospital Stay (HOSPITAL_COMMUNITY): Payer: Medicare Other

## 2019-09-07 ENCOUNTER — Encounter (HOSPITAL_COMMUNITY)
Admission: RE | Disposition: A | Discharge status: Home / Self Care | Payer: Self-pay | Source: Home / Self Care | Attending: Neurological Surgery

## 2019-09-07 DIAGNOSIS — Z7984 Long term (current) use of oral hypoglycemic drugs: Secondary | ICD-10-CM | POA: Insufficient documentation

## 2019-09-07 DIAGNOSIS — Z87891 Personal history of nicotine dependence: Secondary | ICD-10-CM | POA: Diagnosis not present

## 2019-09-07 DIAGNOSIS — Z79899 Other long term (current) drug therapy: Secondary | ICD-10-CM | POA: Diagnosis not present

## 2019-09-07 DIAGNOSIS — Z853 Personal history of malignant neoplasm of breast: Secondary | ICD-10-CM | POA: Diagnosis not present

## 2019-09-07 DIAGNOSIS — Z9013 Acquired absence of bilateral breasts and nipples: Secondary | ICD-10-CM | POA: Insufficient documentation

## 2019-09-07 DIAGNOSIS — G43909 Migraine, unspecified, not intractable, without status migrainosus: Secondary | ICD-10-CM | POA: Insufficient documentation

## 2019-09-07 DIAGNOSIS — M4807 Spinal stenosis, lumbosacral region: Secondary | ICD-10-CM | POA: Insufficient documentation

## 2019-09-07 DIAGNOSIS — M797 Fibromyalgia: Secondary | ICD-10-CM | POA: Diagnosis not present

## 2019-09-07 DIAGNOSIS — E119 Type 2 diabetes mellitus without complications: Secondary | ICD-10-CM | POA: Insufficient documentation

## 2019-09-07 DIAGNOSIS — K219 Gastro-esophageal reflux disease without esophagitis: Secondary | ICD-10-CM | POA: Diagnosis not present

## 2019-09-07 DIAGNOSIS — M4317 Spondylolisthesis, lumbosacral region: Principal | ICD-10-CM | POA: Insufficient documentation

## 2019-09-07 DIAGNOSIS — Z419 Encounter for procedure for purposes other than remedying health state, unspecified: Secondary | ICD-10-CM

## 2019-09-07 DIAGNOSIS — Z981 Arthrodesis status: Secondary | ICD-10-CM

## 2019-09-07 LAB — GLUCOSE, CAPILLARY
Glucose-Capillary: 67 mg/dL — ABNORMAL LOW (ref 70–99)
Glucose-Capillary: 129 mg/dL — ABNORMAL HIGH (ref 70–99)
Glucose-Capillary: 101 mg/dL — ABNORMAL HIGH (ref 70–99)
Glucose-Capillary: 52 mg/dL — ABNORMAL LOW (ref 70–99)
Glucose-Capillary: 93 mg/dL (ref 70–99)
Glucose-Capillary: 77 mg/dL (ref 70–99)
Glucose-Capillary: 125 mg/dL — ABNORMAL HIGH (ref 70–99)
Glucose-Capillary: 235 mg/dL — ABNORMAL HIGH (ref 70–99)
Glucose-Capillary: 363 mg/dL — ABNORMAL HIGH (ref 70–99)

## 2019-09-07 LAB — PROTIME-INR
INR: 1.1 (ref 0.8–1.2)
Prothrombin Time: 14.4 seconds (ref 11.4–15.2)

## 2019-09-07 SURGERY — POSTERIOR LUMBAR FUSION 1 LEVEL
Anesthesia: General | Site: Back

## 2019-09-07 MED ORDER — SODIUM CHLORIDE 0.9% FLUSH: 3.0000 mL | Freq: Two times a day (BID) | INTRAVENOUS | Status: DC

## 2019-09-07 MED ORDER — ESCITALOPRAM OXALATE 20 MG PO TABS
20.0000 mg | ORAL_TABLET | Freq: Every day | ORAL | Status: DC
  Administered 2019-09-07: 20 mg via ORAL
  Filled 2019-09-07 (×3): qty 1

## 2019-09-07 MED ORDER — THROMBIN 20000 UNITS EX SOLR
CUTANEOUS | Status: AC
  Filled 2019-09-07: qty 20000

## 2019-09-07 MED ORDER — OXYCODONE HCL 5 MG PO TABS: 5.0000 mg | ORAL_TABLET | ORAL | Status: DC | PRN

## 2019-09-07 MED ORDER — HEPARIN SODIUM (PORCINE) 1000 UNIT/ML IJ SOLN
INTRAMUSCULAR | Status: AC
  Filled 2019-09-07: qty 1

## 2019-09-07 MED ORDER — ACETAMINOPHEN 650 MG RE SUPP: 650.0000 mg | RECTAL | Status: DC | PRN

## 2019-09-07 MED ORDER — VANCOMYCIN HCL IN DEXTROSE 750-5 MG/150ML-% IV SOLN: 750.0000 mg | Freq: Once | INTRAVENOUS | Status: DC

## 2019-09-07 MED ORDER — OXYCODONE HCL 5 MG PO TABS
5.0000 mg | ORAL_TABLET | ORAL | Status: DC | PRN
  Administered 2019-09-07 – 2019-09-08 (×2): 10 mg via ORAL
  Filled 2019-09-07 (×2): qty 2

## 2019-09-07 MED ORDER — DEXTROSE 50 % IV SOLN
25.0000 mL | Freq: Once | INTRAVENOUS | Status: AC
  Administered 2019-09-07: 25 mL via INTRAVENOUS

## 2019-09-07 MED ORDER — ACETAMINOPHEN 325 MG PO TABS: 650.0000 mg | ORAL_TABLET | ORAL | Status: DC | PRN

## 2019-09-07 MED ORDER — GABAPENTIN 400 MG PO CAPS
400.0000 mg | ORAL_CAPSULE | Freq: Two times a day (BID) | ORAL | Status: DC
  Administered 2019-09-07: 400 mg via ORAL
  Filled 2019-09-07: qty 1

## 2019-09-07 MED ORDER — MENTHOL 3 MG MT LOZG: 1.0000 | LOZENGE | OROMUCOSAL | Status: DC | PRN

## 2019-09-07 MED ORDER — INSULIN ASPART 100 UNIT/ML ~~LOC~~ SOLN
0.0000 [IU] | Freq: Every day | SUBCUTANEOUS | Status: DC
  Administered 2019-09-07: 5 [IU] via SUBCUTANEOUS

## 2019-09-07 MED ORDER — DEXTROSE 50 % IV SOLN: 25.0000 mL | Freq: Once | INTRAVENOUS | Status: AC

## 2019-09-07 MED ORDER — METHOCARBAMOL 1000 MG/10ML IJ SOLN
500.0000 mg | Freq: Four times a day (QID) | INTRAVENOUS | Status: DC | PRN
  Filled 2019-09-07: qty 5

## 2019-09-07 MED ORDER — ARTHREX ANGEL - ACD-A SOLUTION (CHARTING ONLY) OPTIME
TOPICAL | Status: DC | PRN
  Administered 2019-09-07: 10 mL via TOPICAL

## 2019-09-07 MED ORDER — PRAMIPEXOLE DIHYDROCHLORIDE 0.25 MG PO TABS
0.3750 mg | ORAL_TABLET | Freq: Every day | ORAL | Status: DC
  Administered 2019-09-07: 0.375 mg via ORAL
  Filled 2019-09-07: qty 2

## 2019-09-07 MED ORDER — PHENOL 1.4 % MT LIQD: 1.0000 | OROMUCOSAL | Status: DC | PRN

## 2019-09-07 MED ORDER — CHLORHEXIDINE GLUCONATE CLOTH 2 % EX PADS: 6.0000 | MEDICATED_PAD | Freq: Once | CUTANEOUS | Status: DC

## 2019-09-07 MED ORDER — BUPIVACAINE HCL (PF) 0.25 % IJ SOLN
INTRAMUSCULAR | Status: AC
  Filled 2019-09-07: qty 30

## 2019-09-07 MED ORDER — BUPIVACAINE HCL (PF) 0.25 % IJ SOLN
INTRAMUSCULAR | Status: DC | PRN
  Administered 2019-09-07: 4 mL

## 2019-09-07 MED ORDER — DEXTROSE 50 % IV SOLN
INTRAVENOUS | Status: AC
  Administered 2019-09-07: 25 mL via INTRAVENOUS
  Filled 2019-09-07: qty 50

## 2019-09-07 MED ORDER — ONDANSETRON HCL 4 MG/2ML IJ SOLN: 4.0000 mg | Freq: Four times a day (QID) | INTRAMUSCULAR | Status: DC | PRN

## 2019-09-07 MED ORDER — GLIMEPIRIDE 2 MG PO TABS
4.0000 mg | ORAL_TABLET | Freq: Two times a day (BID) | ORAL | Status: DC
  Administered 2019-09-08: 08:00:00 4 mg via ORAL
  Filled 2019-09-07 (×2): qty 2

## 2019-09-07 MED ORDER — INSULIN ASPART 100 UNIT/ML ~~LOC~~ SOLN
0.0000 [IU] | Freq: Three times a day (TID) | SUBCUTANEOUS | Status: DC
  Administered 2019-09-08: 3 [IU] via SUBCUTANEOUS

## 2019-09-07 MED ORDER — HEPARIN SODIUM (PORCINE) 1000 UNIT/ML IJ SOLN
INTRAMUSCULAR | Status: DC | PRN
  Administered 2019-09-07: 5000 [IU]

## 2019-09-07 MED ORDER — VANCOMYCIN HCL IN DEXTROSE 1-5 GM/200ML-% IV SOLN
1000.0000 mg | INTRAVENOUS | Status: AC
  Administered 2019-09-07: 1000 mg via INTRAVENOUS
  Filled 2019-09-07: qty 200

## 2019-09-07 MED ORDER — METFORMIN HCL 500 MG PO TABS
1000.0000 mg | ORAL_TABLET | Freq: Two times a day (BID) | ORAL | Status: DC
  Administered 2019-09-07 – 2019-09-08 (×2): 1000 mg via ORAL
  Filled 2019-09-07 (×2): qty 2

## 2019-09-07 MED ORDER — FENTANYL CITRATE (PF) 250 MCG/5ML IJ SOLN
INTRAMUSCULAR | Status: DC | PRN
  Administered 2019-09-07: 50 ug via INTRAVENOUS
  Administered 2019-09-07: 100 ug via INTRAVENOUS
  Administered 2019-09-07 (×2): 50 ug via INTRAVENOUS

## 2019-09-07 MED ORDER — THROMBIN 5000 UNITS EX SOLR
OROMUCOSAL | Status: DC | PRN
  Administered 2019-09-07: 5 mL via TOPICAL

## 2019-09-07 MED ORDER — LACTATED RINGERS IV SOLN: INTRAVENOUS | Status: DC

## 2019-09-07 MED ORDER — CELECOXIB 200 MG PO CAPS
200.0000 mg | ORAL_CAPSULE | Freq: Two times a day (BID) | ORAL | Status: DC
  Administered 2019-09-07: 200 mg via ORAL
  Filled 2019-09-07: qty 1

## 2019-09-07 MED ORDER — INSULIN ASPART 100 UNIT/ML ~~LOC~~ SOLN
0.0000 [IU] | Freq: Three times a day (TID) | SUBCUTANEOUS | Status: DC
  Administered 2019-09-07: 5 [IU] via SUBCUTANEOUS

## 2019-09-07 MED ORDER — SODIUM CHLORIDE 0.9 % IJ SOLN
INTRAMUSCULAR | Status: DC | PRN
  Administered 2019-09-07: 5 mL

## 2019-09-07 MED ORDER — MORPHINE SULFATE (PF) 2 MG/ML IV SOLN: 2.0000 mg | INTRAVENOUS | Status: DC | PRN

## 2019-09-07 MED ORDER — FENOFIBRATE 160 MG PO TABS
160.0000 mg | ORAL_TABLET | Freq: Every day | ORAL | Status: DC
  Filled 2019-09-07: qty 1

## 2019-09-07 MED ORDER — ROCURONIUM BROMIDE 50 MG/5ML IV SOSY
PREFILLED_SYRINGE | INTRAVENOUS | Status: DC | PRN
  Administered 2019-09-07 (×3): 10 mg via INTRAVENOUS
  Administered 2019-09-07: 60 mg via INTRAVENOUS

## 2019-09-07 MED ORDER — METHOCARBAMOL 500 MG PO TABS
500.0000 mg | ORAL_TABLET | Freq: Four times a day (QID) | ORAL | Status: DC | PRN
  Administered 2019-09-07 – 2019-09-08 (×2): 500 mg via ORAL
  Filled 2019-09-07 (×2): qty 1

## 2019-09-07 MED ORDER — SODIUM CHLORIDE 0.9 % IV SOLN: 250.0000 mL | INTRAVENOUS | Status: DC

## 2019-09-07 MED ORDER — PROPOFOL 10 MG/ML IV BOLUS
INTRAVENOUS | Status: DC | PRN
  Administered 2019-09-07: 150 mg via INTRAVENOUS

## 2019-09-07 MED ORDER — PROPOFOL 10 MG/ML IV BOLUS
INTRAVENOUS | Status: AC
  Filled 2019-09-07: qty 20

## 2019-09-07 MED ORDER — HYDROMORPHONE HCL 1 MG/ML IJ SOLN
0.2500 mg | INTRAMUSCULAR | Status: DC | PRN
  Administered 2019-09-07 (×2): 0.5 mg via INTRAVENOUS

## 2019-09-07 MED ORDER — PHENYLEPHRINE 40 MCG/ML (10ML) SYRINGE FOR IV PUSH (FOR BLOOD PRESSURE SUPPORT)
PREFILLED_SYRINGE | INTRAVENOUS | Status: DC | PRN
  Administered 2019-09-07: 80 ug via INTRAVENOUS
  Administered 2019-09-07: 40 ug via INTRAVENOUS

## 2019-09-07 MED ORDER — FAMOTIDINE 20 MG PO TABS
20.0000 mg | ORAL_TABLET | Freq: Every day | ORAL | Status: DC
  Administered 2019-09-07: 20 mg via ORAL
  Filled 2019-09-07: qty 1

## 2019-09-07 MED ORDER — SUGAMMADEX SODIUM 200 MG/2ML IV SOLN
INTRAVENOUS | Status: DC | PRN
  Administered 2019-09-07: 200 mg via INTRAVENOUS

## 2019-09-07 MED ORDER — EPHEDRINE SULFATE-NACL 50-0.9 MG/10ML-% IV SOSY
PREFILLED_SYRINGE | INTRAVENOUS | Status: DC | PRN
  Administered 2019-09-07 (×2): 5 mg via INTRAVENOUS

## 2019-09-07 MED ORDER — THROMBIN 5000 UNITS EX SOLR
CUTANEOUS | Status: AC
  Filled 2019-09-07: qty 5000

## 2019-09-07 MED ORDER — ATENOLOL 50 MG PO TABS: 50.0000 mg | ORAL_TABLET | Freq: Every day | ORAL | Status: DC

## 2019-09-07 MED ORDER — DEXAMETHASONE SODIUM PHOSPHATE 10 MG/ML IJ SOLN
10.0000 mg | Freq: Once | INTRAMUSCULAR | Status: AC
  Administered 2019-09-07: 10 mg via INTRAVENOUS
  Filled 2019-09-07: qty 1

## 2019-09-07 MED ORDER — ONDANSETRON HCL 4 MG PO TABS: 4.0000 mg | ORAL_TABLET | Freq: Four times a day (QID) | ORAL | Status: DC | PRN

## 2019-09-07 MED ORDER — 0.9 % SODIUM CHLORIDE (POUR BTL) OPTIME
TOPICAL | Status: DC | PRN
  Administered 2019-09-07: 10:00:00 1000 mL

## 2019-09-07 MED ORDER — LIDOCAINE 2% (20 MG/ML) 5 ML SYRINGE
INTRAMUSCULAR | Status: DC | PRN
  Administered 2019-09-07: 60 mg via INTRAVENOUS

## 2019-09-07 MED ORDER — SODIUM CHLORIDE 0.9% FLUSH: 3.0000 mL | INTRAVENOUS | Status: DC | PRN

## 2019-09-07 MED ORDER — THROMBIN 20000 UNITS EX SOLR
CUTANEOUS | Status: DC | PRN
  Administered 2019-09-07: 20 mL via TOPICAL

## 2019-09-07 MED ORDER — SODIUM CHLORIDE 0.9 % IV SOLN
INTRAVENOUS | Status: DC | PRN
  Administered 2019-09-07: 500 mL

## 2019-09-07 MED ORDER — POTASSIUM CHLORIDE IN NACL 20-0.9 MEQ/L-% IV SOLN: INTRAVENOUS | Status: DC

## 2019-09-07 MED ORDER — SENNA 8.6 MG PO TABS
1.0000 | ORAL_TABLET | Freq: Two times a day (BID) | ORAL | Status: DC
  Administered 2019-09-07: 8.6 mg via ORAL
  Filled 2019-09-07: qty 1

## 2019-09-07 MED ORDER — ONDANSETRON HCL 4 MG/2ML IJ SOLN
INTRAMUSCULAR | Status: DC | PRN
  Administered 2019-09-07: 4 mg via INTRAVENOUS

## 2019-09-07 MED ORDER — PANTOPRAZOLE SODIUM 40 MG PO TBEC
40.0000 mg | DELAYED_RELEASE_TABLET | Freq: Every day | ORAL | Status: DC
  Administered 2019-09-07: 40 mg via ORAL
  Filled 2019-09-07: qty 1

## 2019-09-07 MED ORDER — HYDROMORPHONE HCL 1 MG/ML IJ SOLN
INTRAMUSCULAR | Status: AC
  Filled 2019-09-07: qty 1

## 2019-09-07 MED ORDER — FENTANYL CITRATE (PF) 250 MCG/5ML IJ SOLN
INTRAMUSCULAR | Status: AC
  Filled 2019-09-07: qty 5

## 2019-09-07 SURGICAL SUPPLY — 61 items
BAG DECANTER FOR FLEXI CONT (MISCELLANEOUS) ×3 IMPLANT
BASKET BONE COLLECTION (BASKET) ×3 IMPLANT
BENZOIN TINCTURE PRP APPL 2/3 (GAUZE/BANDAGES/DRESSINGS) ×3 IMPLANT
BLADE CLIPPER SURG (BLADE) IMPLANT
BUR CARBIDE MATCH 3.0 (BURR) ×3 IMPLANT
CANISTER SUCT 3000ML PPV (MISCELLANEOUS) ×3 IMPLANT
CARTRIDGE OIL MAESTRO DRILL (MISCELLANEOUS) ×1 IMPLANT
CLOSURE WOUND 1/2 X4 (GAUZE/BANDAGES/DRESSINGS) ×2
CONT SPEC 4OZ CLIKSEAL STRL BL (MISCELLANEOUS) ×3 IMPLANT
COVER BACK TABLE 60X90IN (DRAPES) ×3 IMPLANT
COVER WAND RF STERILE (DRAPES) ×3 IMPLANT
DERMABOND ADVANCED (GAUZE/BANDAGES/DRESSINGS) ×2
DERMABOND ADVANCED .7 DNX12 (GAUZE/BANDAGES/DRESSINGS) ×1 IMPLANT
DIFFUSER DRILL AIR PNEUMATIC (MISCELLANEOUS) ×3 IMPLANT
DRAPE C-ARM 42X72 X-RAY (DRAPES) ×3 IMPLANT
DRAPE C-ARMOR (DRAPES) ×3 IMPLANT
DRAPE LAPAROTOMY 100X72X124 (DRAPES) ×3 IMPLANT
DRAPE SURG 17X23 STRL (DRAPES) ×3 IMPLANT
DURAPREP 26ML APPLICATOR (WOUND CARE) ×3 IMPLANT
ELECT REM PT RETURN 9FT ADLT (ELECTROSURGICAL) ×3
ELECTRODE REM PT RTRN 9FT ADLT (ELECTROSURGICAL) ×1 IMPLANT
EVACUATOR 1/8 PVC DRAIN (DRAIN) ×3 IMPLANT
GAUZE 4X4 16PLY RFD (DISPOSABLE) IMPLANT
GLOVE BIO SURGEON STRL SZ7 (GLOVE) ×3 IMPLANT
GLOVE BIO SURGEON STRL SZ8 (GLOVE) ×6 IMPLANT
GLOVE BIOGEL PI IND STRL 7.0 (GLOVE) ×1 IMPLANT
GLOVE BIOGEL PI INDICATOR 7.0 (GLOVE) ×2
GOWN STRL REUS W/ TWL LRG LVL3 (GOWN DISPOSABLE) ×3 IMPLANT
GOWN STRL REUS W/ TWL XL LVL3 (GOWN DISPOSABLE) ×1 IMPLANT
GOWN STRL REUS W/TWL 2XL LVL3 (GOWN DISPOSABLE) IMPLANT
GOWN STRL REUS W/TWL LRG LVL3 (GOWN DISPOSABLE) ×6
GOWN STRL REUS W/TWL XL LVL3 (GOWN DISPOSABLE) ×2
HEMOSTAT POWDER KIT SURGIFOAM (HEMOSTASIS) ×3 IMPLANT
KIT BASIN OR (CUSTOM PROCEDURE TRAY) ×3 IMPLANT
KIT BONE MRW ASP ANGEL CPRP (KITS) ×3 IMPLANT
KIT TURNOVER KIT B (KITS) ×3 IMPLANT
MILL MEDIUM DISP (BLADE) ×3 IMPLANT
NEEDLE HYPO 25X1 1.5 SAFETY (NEEDLE) ×3 IMPLANT
NS IRRIG 1000ML POUR BTL (IV SOLUTION) ×3 IMPLANT
OIL CARTRIDGE MAESTRO DRILL (MISCELLANEOUS) ×3
PACK LAMINECTOMY NEURO (CUSTOM PROCEDURE TRAY) ×3 IMPLANT
PAD ARMBOARD 7.5X6 YLW CONV (MISCELLANEOUS) IMPLANT
PUTTY DBM ALLOSYNC PURE 10CC (Putty) ×3 IMPLANT
ROD LORD LIPPED TI 5.5X40 (Rod) ×6 IMPLANT
SCREW CORT SHANK MOD 6.5X40 (Screw) ×6 IMPLANT
SCREW POLYAXIAL TULIP (Screw) ×12 IMPLANT
SCREW SHANK MOD 5.5X40 (Screw) ×6 IMPLANT
SET SCREW (Screw) ×8 IMPLANT
SET SCREW SPNE (Screw) ×4 IMPLANT
SPACER PS POROUS 8X9X25 10D (Spacer) ×6 IMPLANT
SPONGE LAP 4X18 RFD (DISPOSABLE) IMPLANT
SPONGE SURGIFOAM ABS GEL 100 (HEMOSTASIS) ×3 IMPLANT
STRIP CLOSURE SKIN 1/2X4 (GAUZE/BANDAGES/DRESSINGS) ×4 IMPLANT
SUT VIC AB 0 CT1 18XCR BRD8 (SUTURE) ×1 IMPLANT
SUT VIC AB 0 CT1 8-18 (SUTURE) ×2
SUT VIC AB 2-0 CP2 18 (SUTURE) ×3 IMPLANT
SUT VIC AB 3-0 SH 8-18 (SUTURE) ×6 IMPLANT
TOWEL GREEN STERILE (TOWEL DISPOSABLE) ×3 IMPLANT
TOWEL GREEN STERILE FF (TOWEL DISPOSABLE) ×3 IMPLANT
TRAY FOLEY MTR SLVR 16FR STAT (SET/KITS/TRAYS/PACK) ×3 IMPLANT
WATER STERILE IRR 1000ML POUR (IV SOLUTION) ×3 IMPLANT

## 2019-09-07 NOTE — Anesthesia Procedure Notes (Signed)
Procedure Name: Intubation Date/Time: 09/07/2019 10:15 AM Performed by: Shirlyn Goltz, CRNA Pre-anesthesia Checklist: Patient identified, Emergency Drugs available, Suction available and Patient being monitored Patient Re-evaluated:Patient Re-evaluated prior to induction Oxygen Delivery Method: Circle system utilized Preoxygenation: Pre-oxygenation with 100% oxygen Induction Type: IV induction Ventilation: Mask ventilation without difficulty Laryngoscope Size: Mac and 4 Grade View: Grade I Tube type: Oral Tube size: 7.0 mm Number of attempts: 1 Airway Equipment and Method: Stylet Placement Confirmation: ETT inserted through vocal cords under direct vision,  positive ETCO2 and breath sounds checked- equal and bilateral Secured at: 21 cm Tube secured with: Tape Dental Injury: Teeth and Oropharynx as per pre-operative assessment

## 2019-09-07 NOTE — Progress Notes (Addendum)
Hypoglycemic Event  CBG: 67  Treatment: D50 25 mL (12.5 gm) @ 08:20.  Symptoms: Headache  Follow-up CBG: Time:08:34 CBG Result:129  Possible Reasons for Event: Medication regimen: Per Patient, there was a recent increase in her Ozempic dose.  Comments/MD notified: Yes, Dr. Ronnald Ramp notified.    Feliz Beam, RN

## 2019-09-07 NOTE — Anesthesia Postprocedure Evaluation (Signed)
Anesthesia Post Note  Patient: Margaret Moody  Procedure(s) Performed: Posterior Lumbar Interbody Fusion- Lumbar five-Sacral one (N/A Back)     Patient location during evaluation: PACU Anesthesia Type: General Level of consciousness: awake Pain management: pain level controlled Vital Signs Assessment: post-procedure vital signs reviewed and stable Respiratory status: spontaneous breathing Cardiovascular status: stable Postop Assessment: no apparent nausea or vomiting Anesthetic complications: no    Last Vitals:  Vitals:   09/07/19 1515 09/07/19 1556  BP:  (!) 148/73  Pulse: 82 82  Resp: 12 19  Temp: (!) 36.1 C 37.1 C  SpO2: 93% 98%    Last Pain:  Vitals:   09/07/19 1556  TempSrc: Oral  PainSc:                  Jene Oravec

## 2019-09-07 NOTE — Care Management CC44 (Signed)
Condition Code 44 Documentation Completed  Patient Details  Name: Margaret Moody MRN: [id number] Date of Birth: Oct 11, 1950   Condition Code 44 given:  Yes Patient signature on Condition Code 44 notice:  Yes(PT contacted via phone due to precautions CSW signed in Pts stead) Documentation of 2 MD's agreement:    Code 44 added to claim:  Yes    Posey Jasmin, LCSW 09/07/2019, 5:33 PM

## 2019-09-07 NOTE — Progress Notes (Signed)
Hypoglycemic Event  CBG: 52 @ 0949  Treatment: D50 25 mL (12.5 gm)  Symptoms: Headache  Follow-up CBG: Time: 10:03 CBG Result:101  Possible Reasons for Event: Other: Per patient, recent increase in her Ozempic dose. Last used 09/03/19  Comments/MD notified:Yes, Dr. Ronnald Ramp & Dr. Ellene Route, RN

## 2019-09-07 NOTE — Transfer of Care (Signed)
Immediate Anesthesia Transfer of Care Note  Patient: Laquia Saulter  Procedure(s) Performed: Posterior Lumbar Interbody Fusion- Lumbar five-Sacral one (N/A Back)  Patient Location: PACU  Anesthesia Type:General  Level of Consciousness: awake, alert , oriented and patient cooperative  Airway & Oxygen Therapy: Patient Spontanous Breathing and Patient connected to nasal cannula oxygen  Post-op Assessment: Report given to RN and Post -op Vital signs reviewed and stable  Post vital signs: Reviewed and stable  Last Vitals:  Vitals Value Taken Time  BP 142/70 09/07/19 1344  Temp    Pulse 78 09/07/19 1345  Resp 15 09/07/19 1345  SpO2 96 % 09/07/19 1345  Vitals shown include unvalidated device data.  Last Pain:  Vitals:   09/07/19 0841  PainSc: 5       Patients Stated Pain Goal: 5 ([id number])  Complications: No apparent anesthesia complications

## 2019-09-07 NOTE — Care Management Obs Status (Signed)
MEDICARE OBSERVATION STATUS NOTIFICATION   Patient Details  Name: Margaret Moody MRN: [id number] Date of Birth: [birth year]   Medicare Observation Status Notification Given:  Yes   Pt reached via telephone due to precautions CSW signed in Pts stead.    Araminta Zorn, LCSW 09/07/2019, 5:32 PM

## 2019-09-07 NOTE — H&P (Signed)
Subjective: Patient is a 69 y.o. female admitted for plif. Onset of symptoms was several months ago, gradually worsening since that time.  The pain is rated severe, and is located at the across the lower back and radiates to RLE. The pain is described as aching and occurs all day. The symptoms have been progressive. Symptoms are exacerbated by exercise. MRI or CT showed spondylolisthesis L5-S1   Past Medical History:  Diagnosis Date  . Anemia   . Arthritis   . Cancer ([place]) 2010   Breast  . Diabetes mellitus without complication ([place])   . Fatty liver   . Fibromyalgia   . GERD (gastroesophageal reflux disease)   . Headache    migraines - takes Atenolol to prevent   . Heart murmur    states she has not had an echo nor does she see a cardiologist    Past Surgical History:  Procedure Laterality Date  . APPENDECTOMY  1972  . 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 EYE SURGERY Bilateral 2017   cataracts  . MASTECTOMY Bilateral 2010  . TONSILLECTOMY     removed when a child  . TUBAL LIGATION  1983    Prior to Admission medications   Medication Sig Start Date End Date Taking? Authorizing Provider  atenolol (TENORMIN) 50 MG tablet Take 50 mg by mouth daily. 06/06/19  Yes [provider]  escitalopram (LEXAPRO) 20 MG tablet Take 20 mg by mouth daily. 06/13/19  Yes [provider]  famotidine (PEPCID) 20 MG tablet Take 20 mg by mouth at bedtime. 08/10/19  Yes [provider]  fenofibrate 160 MG tablet Take 160 mg by mouth daily. 06/19/19  Yes [provider]  gabapentin (NEURONTIN) 400 MG capsule Take 400 mg by mouth 2 (two) times daily. 08/04/19  Yes [provider]  glimepiride (AMARYL) 4 MG tablet Take 4 mg by mouth 2 (two) times daily. 07/30/19  Yes [provider]  metFORMIN (GLUCOPHAGE) 1000 MG tablet Take 1,000 mg by mouth 2 (two) times daily. 06/12/19  Yes [provider]  OZEMPIC, 1 MG/DOSE, 2 MG/1.5ML SOPN Inject 1  mg into the skin every Monday. 07/18/19  Yes [provider]  pantoprazole (PROTONIX) 40 MG tablet Take 40 mg by mouth daily. 08/04/19  Yes [provider]  pramipexole (MIRAPEX) 0.125 MG tablet Take 0.375 mg by mouth at bedtime.  08/06/19  Yes [provider]  BLACK ELDERBERRY PO Take 1 capsule by mouth daily.     [provider]  Certolizumab Pegol (CIMZIA PREFILLED) 2 X 200 MG/ML KIT Inject 400 mg into the skin every 30 (thirty) days.    [provider]  SUMAtriptan (IMITREX) 100 MG tablet Take 100 mg by mouth 2 (two) times daily as needed for migraine.  05/18/19   [provider]   Allergies  Allergen Reactions  . Penicillins Anaphylaxis    Did it involve swelling of the face/tongue/throat, SOB, or low BP? Yes Did it involve sudden or severe rash/hives, skin peeling, or any reaction on the inside of your mouth or nose? No Did you need to seek medical attention at a hospital or doctor's office? Yes When did it last happen?1990 If all above answers are "NO", may proceed with cephalosporin use.   . Ace Inhibitors Cough  . Ambien [Zolpidem Tartrate] Itching    Social History   Tobacco Use  . Smoking status: Former Smoker    Types: Cigarettes  Quit date: 73    Years since quitting: 26.0  . Smokeless tobacco: Never Used  Substance Use Topics  . Alcohol use: Not Currently    Comment: occasional    History reviewed. No pertinent family history.   Review of Systems  Positive ROS: neg  All other systems have been reviewed and were otherwise negative with the exception of those mentioned in the HPI and as above.  Objective: Vital signs in last 24 hours: Temp:  [98.1 F (36.7 C)] 98.1 F (36.7 C) (01/08 0757) Pulse Rate:  [77] 77 (01/08 0757) Resp:  [18] 18 (01/08 0757) BP: (147)/(74) 147/74 (01/08 0757) SpO2:  [99 %] 99 % (01/08 0757) Weight:  [65.8 kg] 65.8 kg (01/08 0757)  General Appearance: Alert, cooperative,  no distress, appears stated age Head: Normocephalic, without obvious abnormality, atraumatic Eyes: PERRL, conjunctiva/corneas clear, EOM's intact    Neck: Supple, symmetrical, trachea midline Back: Symmetric, no curvature, ROM normal, no CVA tenderness Lungs:  respirations unlabored Heart: Regular rate and rhythm Abdomen: Soft, non-tender Extremities: Extremities normal, atraumatic, no cyanosis or edema Pulses: 2+ and symmetric all extremities Skin: Skin color, texture, turgor normal, no rashes or lesions  NEUROLOGIC:   Mental status: Alert and oriented x4,  no aphasia, good attention span, fund of knowledge, and memory Motor Exam - grossly normal Sensory Exam - grossly normal Reflexes: 1+ Coordination - grossly normal Gait - grossly normal Balance - grossly normal Cranial Nerves: I: smell Not tested  II: visual acuity  OS: nl    OD: nl  II: visual fields Full to confrontation  II: pupils Equal, round, reactive to light  III,VII: ptosis None  III,IV,VI: extraocular muscles  Full ROM  V: mastication Normal  V: facial light touch sensation  Normal  V,VII: corneal reflex  Present  VII: facial muscle function - upper  Normal  VII: facial muscle function - lower Normal  VIII: hearing Not tested  IX: soft palate elevation  Normal  IX,X: gag reflex Present  XI: trapezius strength  5/5  XI: sternocleidomastoid strength 5/5  XI: neck flexion strength  5/5  XII: tongue strength  Normal    Data Review Lab Results  Component Value Date   WBC 5.2 09/04/2019   HGB 11.9 (L) 09/04/2019   HCT 38.5 09/04/2019   MCV 93.4 09/04/2019   PLT 292 09/04/2019   Lab Results  Component Value Date   NA 139 09/04/2019   K 4.0 09/04/2019   CL 102 09/04/2019   CO2 25 09/04/2019   BUN 9 09/04/2019   CREATININE 0.71 09/04/2019   GLUCOSE 126 (H) 09/04/2019   Lab Results  Component Value Date   INR 1.0 08/14/2019    Assessment/Plan:  Estimated body mass index is 23.4 kg/m as  calculated from the following:   Height as of this encounter: _0  (1.676 m).   Weight as of this encounter: 65.8 kg. Patient admitted for PLIF L5-S1. Patient has failed a reasonable attempt at conservative therapy.  I explained the condition and procedure to the patient and answered any questions.  Patient wishes to proceed with procedure as planned. Understands risks/ benefits and typical outcomes of procedure.   Eustace Moore 09/07/2019 9:33 AM

## 2019-09-07 NOTE — Op Note (Signed)
09/07/2019  1:27 PM  PATIENT:  Margaret Moody  69 y.o. 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 Decompressive lumbar laminectomy with hemifacetectomy and foraminotomies (Gill type) L5-S1 requiring more work than would be required for a simple exposure of the disk for PLIF in order to adequately decompress the neural elements and address the spinal stenosis 2. Posterior lumbar interbody fusion L5-S1 using porous titanium interbody cages packed with morcellized allograft and autograft soaked with a bone rasp were obtained through a separate fascial incision 3. Posterior fixation L5-S1 using Alphatec cortical pedicle screws.  4. Intertransverse arthrodesis L5-S1 using morcellized autograft and allograft.  SURGEON:  Sherley Bounds, MD  ASSISTANTS: Glenford Peers, FNP  ANESTHESIA:  General  EBL: 300 ml  Total I/O In: 1000 [I.V.:1000] Out: 1230 [Urine:930; Blood:300]  BLOOD ADMINISTERED:none  DRAINS: none   INDICATION FOR PROCEDURE: This patient presented with severe back and right leg pain. Imaging revealed progressive listhesis at L5-S1 with L5 pars defects and foraminal stenosis. The patient tried a reasonable attempt at conservative medical measures without relief. I recommended decompression and instrumented fusion to address the stenosis as well as the segmental  instability.  Patient understood the risks, benefits, and alternatives and potential outcomes and wished to proceed.  PROCEDURE DETAILS:  The patient was brought to the operating room. After induction of generalized endotracheal anesthesia the patient was rolled into the prone position on chest rolls and all pressure points were padded. The patient's lumbar region was cleaned and then prepped with DuraPrep and draped in the usual sterile fashion. Anesthesia was injected and then a dorsal midline incision was  made and carried down to the lumbosacral fascia. The fascia was opened and the paraspinous musculature was taken down in a subperiosteal fashion to expose L5-S1. A self-retaining retractor was placed. Intraoperative fluoroscopy confirmed my level, and I started with placement of the L5 cortical pedicle screws. The pedicle screw entry zones were identified utilizing surface landmarks and  AP and lateral fluoroscopy. I scored the cortex with the high-speed drill and then used the hand drill to drill an upward and outward direction into the pedicle. I then tapped line to line. I then placed a 5.5 x 40 mm cortical pedicle screw into the pedicles of L5 bilaterally.  I then dissected in a suprafascial plane to expose the iliac crest.  Opened the fascia and we used a Jamshidi needle to extract 60 cc of bone marrow aspirate from the iliac crest with the assistance of my nurse practitioner.  This was then spun down by [facility] device and 2 to 4 cc of  BMAC was soaked on morselized allograft for later arthrodesis.  I dried the hole with Surgifoam and closed the fascia.  I then turned my attention to the decompression and complete lumbar laminectomies, hemi- facetectomies, and foraminotomies were performed at L5-S1.  The patient had pars defects and therefore a Gill type decompression was performed.  My nurse practitioner was directly involved in the decompression and exposure of the neural elements. the patient had significant spinal stenosis and this required more work than would be required for a simple exposure of the disc for posterior lumbar interbody fusion which would only require a limited laminotomy. Much more generous decompression and generous foraminotomy was undertaken in order to adequately decompress the neural elements and address the patient's leg pain. The yellow ligament was removed to expose the  underlying dura and nerve roots, and generous foraminotomies were performed to adequately decompress the neural  elements. Both the exiting and traversing nerve roots were decompressed on both sides until a coronary dilator passed easily along the nerve roots. Once the decompression was complete, I turned my attention to the posterior lower lumbar interbody fusion. The epidural venous vasculature was coagulated and cut sharply. Disc space was incised and the initial discectomy was performed with pituitary rongeurs. The disc space was distracted with sequential distractors to a height of 8 mm. We then used a series of scrapers and shavers to prepare the endplates for fusion. The midline was prepared with Epstein curettes. Once the complete discectomy was finished, we packed an appropriate sized interbody cage with local autograft and morcellized allograft, gently retracted the nerve root, and tapped the cage into position at L5-S1.  The midline between the cages was packed with morselized autograft and allograft. We then turned our attention to the placement of the lower pedicle screws. The pedicle screw entry zones were identified utilizing surface landmarks and fluoroscopy. I drilled into each pedicle utilizing the hand drill, and tapped each pedicle with the appropriate tap. We palpated with a ball probe to assure no break in the cortex. We then placed 6.5 x 40 mm pedicle screws into the pedicles bilaterally at S1.  My nurse practitioner assisted in placement of the pedicle screws.  We then decorticated the transverse processes and laid a mixture of morcellized autograft and allograft out over these to perform intertransverse arthrodesis at L5-S1 bilaterally. We then placed lordotic rods into the multiaxial screw heads of the pedicle screws and locked these in position with the locking caps and anti-torque device. We then checked our construct with AP and lateral fluoroscopy. Irrigated with copious amounts of bacitracin-containing saline solution. Inspected the nerve roots once again to assure adequate decompression, lined  to the dura with Gelfoam, placed powdered vancomycin into the wound, and then we closed the muscle and the fascia with 0 Vicryl. Closed the subcutaneous tissues with 2-0 Vicryl and subcuticular tissues with 3-0 Vicryl. The skin was closed with benzoin and Steri-Strips. Dressing was then applied, the patient was awakened from general anesthesia and transported to the recovery room in stable condition. At the end of the procedure all sponge, needle and instrument counts were correct.   PLAN OF CARE: admit to inpatient  PATIENT DISPOSITION:  PACU - hemodynamically stable.   Delay start of Pharmacological VTE agent (>24hrs) due to surgical blood loss or risk of bleeding:  yes

## 2019-09-07 NOTE — Progress Notes (Signed)
Pharmacy Antibiotic Note  Margaret Moody is a 69 y.o. female admitted on 09/07/2019 for lumbar surgery.  Pharmacy has been consulted for Vancomycin dosing x 1 dose post-op for surgical prophylaxis.   Vancomycin 1gm IV given pre-op at 10:20 am.  No drain.  Plan:  Vancomycin 750 mg IV x 1 at 10pm tonight.  No follow up needed.  Pharmacy signing off.  Height: 5\' 6"  (167.6 cm) Weight: 145 lb (65.8 kg) IBW/kg (Calculated) : 59.3  Temp (24hrs), Avg:98.1 F (36.7 C), Min:97 F (36.1 C), Max:98.7 F (37.1 C)  Recent Labs  Lab 09/04/19 1030  WBC 5.2  CREATININE 0.71    Estimated Creatinine Clearance: 64.8 mL/min (by C-G formula based on SCr of 0.71 mg/dL).    Allergies  Allergen Reactions  . Penicillins Anaphylaxis    Did it involve swelling of the face/tongue/throat, SOB, or low BP? Yes Did it involve sudden or severe rash/hives, skin peeling, or any reaction on the inside of your mouth or nose? No Did you need to seek medical attention at a hospital or doctor's office? Yes When did it last happen?1990 If all above answers are "NO", may proceed with cephalosporin use.   . Ace Inhibitors Cough  . Ambien [Zolpidem Tartrate] Itching    Thank you for allowing pharmacy to be a part of this patient's care.  Arty Baumgartner, [place] Phone: [phone] 09/07/2019 4:15 PM

## 2019-09-08 DIAGNOSIS — M4317 Spondylolisthesis, lumbosacral region: Secondary | ICD-10-CM | POA: Diagnosis not present

## 2019-09-08 LAB — GLUCOSE, CAPILLARY: Glucose-Capillary: 193 mg/dL — ABNORMAL HIGH (ref 70–99)

## 2019-09-08 MED ORDER — METHOCARBAMOL 750 MG PO TABS: 750.0000 mg | ORAL_TABLET | Freq: Three times a day (TID) | ORAL | 1 refills | Status: AC | PRN

## 2019-09-08 MED ORDER — OXYCODONE HCL 5 MG PO TABS: 5.0000 mg | ORAL_TABLET | ORAL | 0 refills | Status: DC | PRN

## 2019-09-08 NOTE — Evaluation (Signed)
Physical Therapy Evaluation Patient Details Name: Margaret Moody MRN: [id number] DOB: April 15, 1951 Today's Date: 09/08/2019   History of Present Illness  Pt is a 69 y/o female s/p PLIF L5-S1. PMH including but not limited to DM, fibromyalgia and breast cancer.  Clinical Impression  Pt presented OOB sitting in recliner chair, awake and willing to participate in therapy session. Prior to admission, pt reported that she was independent with all functional mobility and ADLs. Pt lives with her husband in a two level house with three steps to enter. She will have assistance from husband and sister upon d/c. At the time of evaluation, pt overall at supervision to modified independence level with functional mobility. Pt ambulating in hallway without use of an AD or physical assistance. She also participated in stair training without difficulties. PT provided pt education re: back precautions, car transfers and a generalized walking program for pt to initiate upon d/c home. Plan is to d/c home today with family support. No further acute PT needs identified at this time. PT signing off.     Follow Up Recommendations No PT follow up    Equipment Recommendations  None recommended by PT    Recommendations for Other Services       Precautions / Restrictions Precautions Precautions: Back Precaution Booklet Issued: Yes (comment) Precaution Comments: reviewed 3/3 back precautions with pt throughout Required Braces or Orthoses: Spinal Brace Spinal Brace: Lumbar corset Restrictions Weight Bearing Restrictions: No      Mobility  Bed Mobility Overal bed mobility: Modified Independent             General bed mobility comments: pt seated OOB in recliner chair upon arrival  Transfers Overall transfer level: Modified independent Equipment used: None                Ambulation/Gait Ambulation/Gait assistance: Supervision Gait Distance (Feet): 400 Feet Assistive device: None Gait  Pattern/deviations: Step-through pattern Gait velocity: WFL   General Gait Details: pt steady overall with hallway ambulation, no LOB or need for physical assistance, supervision for safety  Stairs Stairs: Yes Stairs assistance: Supervision Stair Management: One rail Left;Alternating pattern;Forwards Number of Stairs: 10    Wheelchair Mobility    Modified Rankin (Stroke Patients Only)       Balance Overall balance assessment: Mild deficits observed, not formally tested                                           Pertinent Vitals/Pain Pain Assessment: 0-10 Pain Score: 1  Pain Location: back Pain Descriptors / Indicators: Aching Pain Intervention(s): Monitored during session;Repositioned    Home Living Family/patient expects to be discharged to:: Private residence Living Arrangements: Spouse/significant other;Other relatives Available Help at Discharge: Family Type of Home: House Home Access: Stairs to enter Entrance Stairs-Rails: Psychiatric nurse of Steps: 3 Home Layout: Two level;Full bath on main level;Able to live on main level with bedroom/bathroom Home Equipment: Shower seat - built in;Hand held shower head Additional Comments: long handle reacher and sponge    Prior Function Level of Independence: Independent         Comments: increase time to complete activities due to pain     Hand Dominance   Dominant Hand: Left    Extremity/Trunk Assessment   Upper Extremity Assessment Upper Extremity Assessment: Defer to OT evaluation;Overall [facility] for tasks assessed    Lower Extremity Assessment Lower Extremity  Assessment: Overall WFL for tasks assessed    Cervical / Trunk Assessment Cervical / Trunk Assessment: Other exceptions Cervical / Trunk Exceptions: s/p lumbar sx  Communication   Communication: No difficulties  Cognition Arousal/Alertness: Awake/alert Behavior During Therapy: WFL for tasks  assessed/performed Overall Cognitive Status: Within Functional Limits for tasks assessed                                        General Comments      Exercises     Assessment/Plan    PT Assessment Patent does not need any further PT services  PT Problem List         PT Treatment Interventions      PT Goals (Current goals can be found in the Care Plan section)  Acute Rehab PT Goals Patient Stated Goal: "go home" PT Goal Formulation: All assessment and education complete, DC therapy    Frequency     Barriers to discharge        Co-evaluation               AM-PAC PT "6 Clicks" Mobility  Outcome Measure Help needed turning from your back to your side while in a flat bed without using bedrails?: None Help needed moving from lying on your back to sitting on the side of a flat bed without using bedrails?: None Help needed moving to and from a bed to a chair (including a wheelchair)?: None Help needed standing up from a chair using your arms (e.g., wheelchair or bedside chair)?: None Help needed to walk in hospital room?: None Help needed climbing 3-5 steps with a railing? : None 6 Click Score: 24    End of Session Equipment Utilized During Treatment: Back brace Activity Tolerance: Patient tolerated treatment well Patient left: in chair;with call bell/phone within reach Nurse Communication: Mobility status PT Visit Diagnosis: Other abnormalities of gait and mobility (R26.89)    Time: [id number] PT Time Calculation (min) (ACUTE ONLY): 13 min   Charges:   PT Evaluation $PT Eval Low Complexity: 1 Low          Eduard Clos, PT, DPT  Acute Rehabilitation Services Pager [phone] Office [place] 09/08/2019, 9:48 AM

## 2019-09-08 NOTE — Evaluation (Addendum)
Occupational Therapy Evaluation Patient Details Name: Margaret Moody MRN: [id number] DOB: Feb 22, 1951 Today's Date: 09/08/2019    History of Present Illness Pt is a 69 yr old female who had Decompressive lumbar laminectomywith hemifacetectomy and foraminotomies (Gill type)L5-S1requiring more work than would be required for a simple exposure of the disk for PLIF in order to adequately decompress the neural elements and address the spinal stenosis, Posterior lumbar interbody fusionL5-S1using porous titaniuminterbody cages packed with morcellized allograft and autograft soaked with a bone rasp were obtained through a separate fascial incision, Posterior fixationL5-S1using Alphateccortical pedicle screws,  Intertransverse arthrodesisL5-S1using morcellized autograft and allograft. PMH but not limited to: anemia, arthritis, breast ca, GERD, headaches, hear murumur    Clinical Impression   Patient is s/p see above surgery resulting in the deficits listed below (see OT Problem List).Pt will have husband and family support when returning home. Pt can live on the main level of home once up 3 steps where she has a walk in shower with built in bench. Pt has already arrange home to increase in ability to complete ADLS. Pt required occasional cued with ADLs to follow precautions but able to recall 3/3 precautions and able to don brace.     Follow Up Recommendations  No OT follow up;Supervision - Intermittent    Equipment Recommendations       Recommendations for Other Services       Precautions / Restrictions Precautions Precautions: Back Precaution Booklet Issued: Yes (comment) Precaution Comments: able to recall 3/3  Required Braces or Orthoses: Spinal Brace Spinal Brace: Lumbar corset Restrictions Weight Bearing Restrictions: No      Mobility Bed Mobility Overal bed mobility: Modified Independent                Transfers Overall transfer level: Modified independent                    Balance Overall balance assessment: Mild deficits observed, not formally tested                                         ADL either performed or assessed with clinical judgement   ADL Overall ADL's : Needs assistance/impaired Eating/Feeding: Independent;Sitting   Grooming: Wash/dry hands;Wash/dry face;Standing;Modified independent   Upper Body Bathing: Modified independent;Sitting   Lower Body Bathing: Supervison/ safety;Sit to/from stand;Cueing for safety;Cueing for sequencing;Cueing for back precautions   Upper Body Dressing : Independent;Sitting;Standing   Lower Body Dressing: Supervision/safety;Cueing for safety;Cueing for sequencing;Cueing for compensatory techniques;Cueing for back precautions;Sit to/from stand   Toilet Transfer: Modified Independent;Comfort height toilet   Toileting- Clothing Manipulation and Hygiene: Modified independent;Sit to/from stand   Tub/ Banker: Modified independent;Grab bars   Functional mobility during ADLs: Supervision/safety(as increase in ambulation decrease stiffness )       Vision Baseline Vision/History: Wears glasses Wears Glasses: Reading only Patient Visual Report: No change from baseline Vision Assessment?: No apparent visual deficits     Perception Perception Perception Tested?: No   Praxis Praxis Praxis tested?: Not tested    Pertinent Vitals/Pain Pain Assessment: 0-10 Pain Score: 1  Pain Location: back Pain Descriptors / Indicators: Aching Pain Intervention(s): Monitored during session;Repositioned;Limited activity within patient's tolerance     Hand Dominance Left   Extremity/Trunk Assessment Upper Extremity Assessment Upper Extremity Assessment: Overall WFL for tasks assessed(hx carpal tunnel)   Lower Extremity Assessment Lower Extremity Assessment: Defer to PT evaluation  Cervical / Trunk Assessment Cervical / Trunk Assessment: Other exceptions Cervical / Trunk  Exceptions: s/p sx   Communication Communication Communication: No difficulties   Cognition Arousal/Alertness: Awake/alert Behavior During Therapy: WFL for tasks assessed/performed Overall Cognitive Status: Within Functional Limits for tasks assessed                                     General Comments       Exercises     Shoulder Instructions      Home Living Family/patient expects to be discharged to:: Private residence Living Arrangements: Spouse/significant other;Other relatives Available Help at Discharge: Family Type of Home: House Home Access: Stairs to enter [employer] of Steps: 3 Entrance Stairs-Rails: Right;Left Home Layout: Two level;Full bath on main level;Able to live on main level with bedroom/bathroom     Bathroom Shower/Tub: Occupational psychologist: Standard Bathroom Accessibility: Yes How Accessible: Accessible via walker Home Equipment: Shower seat - built in;Hand held shower head   Additional Comments: long handle reacher and sponge      Prior Functioning/Environment Level of Independence: Independent        Comments: increase time to complete activities due to pain        OT Problem List: Decreased strength;Decreased activity tolerance;Impaired balance (sitting and/or standing);Decreased safety awareness;Decreased knowledge of use of DME or AE;Decreased knowledge of precautions;Pain      OT Treatment/Interventions:      OT Goals(Current goals can be found in the care plan section) Acute Rehab OT Goals Patient Stated Goal: To remain pain free OT Goal Formulation: With patient Time For Goal Achievement: 09/15/19 Potential to Achieve Goals: Good  OT Frequency:     Barriers to D/C:            Co-evaluation              AM-PAC OT "6 Clicks" Daily Activity     Outcome Measure   Help from another person taking care of personal grooming?: None Help from another person toileting, which includes  using toliet, bedpan, or urinal?: None Help from another person bathing (including washing, rinsing, drying)?: A Little Help from another person to put on and taking off regular upper body clothing?: None Help from another person to put on and taking off regular lower body clothing?: None 6 Click Score: 19   End of Session Equipment Utilized During Treatment: Gait belt;Back brace  Activity Tolerance: Patient tolerated treatment well Patient left: in bed;with call bell/phone within reach  OT Visit Diagnosis: Unsteadiness on feet (R26.81);Muscle weakness (generalized) (M62.81);Pain Pain - part of body: (back)                Time: [id number] OT Time Calculation (min): 20 min Charges:  OT General Charges $OT Visit: 1 Visit OT Evaluation $OT Eval Low Complexity: 1 Low OT Treatments $Self Care/Home Management : 9-22 mins  Joeseph Amor OTR/L  Acute Rehab Services  [phone] office number [phone] pager number   Joeseph Amor 09/08/2019, 8:15 AM

## 2019-09-08 NOTE — Progress Notes (Signed)
  NEUROSURGERY PROGRESS NOTE   No issues overnight.  Complains of appropriate back soreness Pre op sx resolved ambulating well. Tolerating po. Voiding normal Eager for d/c  EXAM:  BP 115/62 (BP Location: Left Arm)   Pulse 85   Temp 98.7 F (37.1 C) (Oral)   Resp 18   Ht 5\' 6"  (1.676 m)   Wt 65.8 kg   SpO2 95%   BMI 23.40 kg/m   Awake, alert, oriented  Speech fluent, appropriate  CN grossly intact  5/5 BUE/BLE  Incision c/d/i  PLAN Post op day 1  Doing well D/c home

## 2019-09-08 NOTE — Plan of Care (Signed)
Patient alert and oriented, mae's well, voiding adequate amount of urine, swallowing without difficulty, no c/o pain at time of discharge. Patient discharged home with family. Script and discharged instructions given to patient. Patient and family stated understanding of instructions given. Patient has an appointment with Dr. Jones °

## 2019-09-08 NOTE — Discharge Summary (Signed)
Physician Discharge Summary  Patient ID: Margaret Moody MRN: [id number] DOB/AGE: 10-31-1950 69 y.o.  Admit date: 09/07/2019 Discharge date: 09/08/2019  Admission Diagnoses:  spondylolisthesis  Discharge Diagnoses:  Same Active Problems:   S/P lumbar fusion   Discharged Condition: Stable  Hospital Course:  Margaret Moody is a 69 y.o. female who was admitted for the below procedure. There were no post operative complications. At time of discharge, pain was well controlled, ambulating with Pt/OT, tolerating po, voiding normal. Ready for discharge.  Treatments: Surgery 1. Decompressive lumbar laminectomy with hemifacetectomy and foraminotomies (Gill type) L5-S1 requiring more work than would be required for a simple exposure of the disk for PLIF in order to adequately decompress the neural elements and address the spinal stenosis 2. Posterior lumbar interbody fusion L5-S1 using porous titanium interbody cages packed with morcellized allograft and autograft soaked with a bone rasp were obtained through a separate fascial incision 3. Posterior fixation L5-S1 using Alphatec cortical pedicle screws.  4. Intertransverse arthrodesis L5-S1 using morcellized autograft and allograft.  Discharge Exam: Blood pressure 107/73, pulse 86, temperature 98.8 F (37.1 C), temperature source Oral, resp. rate 18, height '5\' 6"'$  (1.676 m), weight 65.8 kg, SpO2 98 %. Awake, alert, oriented Speech fluent, appropriate CN grossly intact 5/5 BUE/BLE Wound c/d/i  Disposition: Discharge disposition: 01-Home or Self Care       Discharge Instructions    Call MD for:  difficulty breathing, headache or visual disturbances   Complete by: As directed    Call MD for:  persistant dizziness or light-headedness   Complete by: As directed    Call MD for:  redness, tenderness, or signs of infection (pain, swelling, redness, odor or green/yellow discharge around incision site)   Complete by: As directed    Call MD for:   severe uncontrolled pain   Complete by: As directed    Call MD for:  temperature >100.4   Complete by: As directed    Diet general   Complete by: As directed    Driving Restrictions   Complete by: As directed    Do not drive until given clearance.   Increase activity slowly   Complete by: As directed    Lifting restrictions   Complete by: As directed    Do not lift anything >10lbs. Avoid bending and twisting in awkward positions. Avoid bending at the back.   May shower / Bathe   Complete by: As directed    In 24 hours. Okay to wash wound with warm soapy water. Avoid scrubbing the wound. Pat dry.   Remove dressing in 24 hours   Complete by: As directed      Allergies as of 09/08/2019      Reactions   Penicillins Anaphylaxis   Did it involve swelling of the face/tongue/throat, SOB, or low BP? Yes Did it involve sudden or severe rash/hives, skin peeling, or any reaction on the inside of your mouth or nose? No Did you need to seek medical attention at a hospital or doctor's office? Yes When did it last happen?1990 If all above answers are "NO", may proceed with cephalosporin use.   Ace Inhibitors Cough   Ambien [zolpidem Tartrate] Itching      Medication List    TAKE these medications   atenolol 50 MG tablet Commonly known as: TENORMIN Take 50 mg by mouth daily.   BLACK ELDERBERRY PO Take 1 capsule by mouth daily.   Cimzia Prefilled 2 X 200 MG/ML Kit Generic drug: Certolizumab Pegol Inject 400  mg into the skin every 30 (thirty) days.   escitalopram 20 MG tablet Commonly known as: LEXAPRO Take 20 mg by mouth daily.   famotidine 20 MG tablet Commonly known as: PEPCID Take 20 mg by mouth at bedtime.   fenofibrate 160 MG tablet Take 160 mg by mouth daily.   gabapentin 400 MG capsule Commonly known as: NEURONTIN Take 400 mg by mouth 2 (two) times daily.   glimepiride 4 MG tablet Commonly known as: AMARYL Take 4 mg by mouth 2 (two) times daily.   metFORMIN  1000 MG tablet Commonly known as: GLUCOPHAGE Take 1,000 mg by mouth 2 (two) times daily.   methocarbamol 750 MG tablet Commonly known as: Robaxin-750 Take 1 tablet (750 mg total) by mouth 3 (three) times daily as needed for muscle spasms.   oxyCODONE 5 MG immediate release tablet Commonly known as: Roxicodone Take 1-2 tablets (5-10 mg total) by mouth every 4 (four) hours as needed for severe pain.   Ozempic (1 MG/DOSE) 2 MG/1.5ML Sopn Generic drug: Semaglutide (1 MG/DOSE) Inject 1 mg into the skin every Monday.   pantoprazole 40 MG tablet Commonly known as: PROTONIX Take 40 mg by mouth daily.   pramipexole 0.125 MG tablet Commonly known as: MIRAPEX Take 0.375 mg by mouth at bedtime.   SUMAtriptan 100 MG tablet Commonly known as: IMITREX Take 100 mg by mouth 2 (two) times daily as needed for migraine.            Durable Medical Equipment  (From admission, onward)         Start     Ordered   09/07/19 1602  DME Walker rolling  Once    Question:  Patient needs a walker to treat with the following condition  Answer:  S/P lumbar fusion   09/07/19 1601   09/07/19 1602  DME 3 n 1  Once     09/07/19 1601         Follow-up Information    Eustace Moore, MD Follow up.   Specialty: Neurosurgery Contact information: [address] [place] [address] [place] [address] [phone]           Signed: Traci Sermon 09/08/2019, 7:53 AM

## 2019-10-30 ENCOUNTER — Other Ambulatory Visit: Payer: Self-pay | Admitting: Neurological Surgery

## 2019-10-30 ENCOUNTER — Telehealth: Payer: Self-pay

## 2019-10-30 DIAGNOSIS — M5417 Radiculopathy, lumbosacral region: Secondary | ICD-10-CM

## 2019-10-30 NOTE — Telephone Encounter (Signed)
Spoke with patient to screen her meds and allergies before being scheduled for a lumbar myelogram.  She was informed she will be here for two hours, will need a driver and will need to be on strict bedrest for 24 hours after the procedure.  She also was informed she will need to hold Lexapro and Imitrex for 48 hours before, and 24 hours after, the myelogram.

## 2019-11-05 ENCOUNTER — Ambulatory Visit
Admission: RE | Admit: 2019-11-05 | Discharge: 2019-11-05 | Disposition: A | Discharge status: Home / Self Care | Payer: Medicare Other | Source: Ambulatory Visit | Attending: Neurological Surgery | Admitting: Neurological Surgery

## 2019-11-05 ENCOUNTER — Other Ambulatory Visit: Payer: Self-pay

## 2019-11-05 VITALS — BP 132/76 | HR 70

## 2019-11-05 DIAGNOSIS — M5417 Radiculopathy, lumbosacral region: Secondary | ICD-10-CM

## 2019-11-05 DIAGNOSIS — M47812 Spondylosis without myelopathy or radiculopathy, cervical region: Secondary | ICD-10-CM

## 2019-11-05 DIAGNOSIS — Z981 Arthrodesis status: Secondary | ICD-10-CM

## 2019-11-05 MED ORDER — IOPAMIDOL (ISOVUE-M 200) INJECTION 41%: 18.0000 mL | Freq: Once | INTRAMUSCULAR | Status: DC

## 2019-11-05 MED ORDER — DIAZEPAM 5 MG PO TABS
5.0000 mg | ORAL_TABLET | Freq: Once | ORAL | Status: AC
  Administered 2019-11-05: 5 mg via ORAL

## 2019-11-05 NOTE — Discharge Instructions (Signed)
Myelogram Discharge Instructions  1. Go home and rest quietly for the next 24 hours.  It is important to lie flat for the next 24 hours.  Get up only to go to the restroom.  You may lie in the bed or on a couch on your back, your stomach, your left side or your right side.  You may have one pillow under your head.  You may have pillows between your knees while you are on your side or under your knees while you are on your back.  2. DO NOT drive today.  Recline the seat as far back as it will go, while still wearing your seat belt, on the way home.  3. You may get up to go to the bathroom as needed.  You may sit up for 10 minutes to eat.  You may resume your normal diet and medications unless otherwise indicated.  Drink lots of extra fluids today and tomorrow.  4. The incidence of headache, nausea, or vomiting is about 5% (one in 20 patients).  If you develop a headache, lie flat and drink plenty of fluids until the headache goes away.  Caffeinated beverages may be helpful.  If you develop severe nausea and vomiting or a headache that does not go away with flat bed rest, call [phone].  5. You may resume normal activities after your 24 hours of bed rest is over; however, do not exert yourself strongly or do any heavy lifting tomorrow. If when you get up you have a headache when standing, go back to bed and force fluids for another 24 hours.  6. Call your physician for a follow-up appointment.  The results of your myelogram will be sent directly to your physician by the following day.  7. If you have any questions or if complications develop after you arrive home, please call [phone].  Discharge instructions have been explained to the patient.  The patient, or the person responsible for the patient, fully understands these instructions  YOU MAY RESUME YOUR LEXAPRO AND SUMATRIPTAN TOMORROW 11/06/2019 AFTER 1030 AM

## 2019-11-05 NOTE — Progress Notes (Signed)
Patient states she has been off Lexapro and Sumatriptan for at least the past two days.

## 2020-07-05 IMAGING — CR DG MYELOGRAPHY LUMBAR INJ LUMBOSACRAL
13 of 18 series · 13 of 18 positions shown · non-contrast
Comparison: MRI of the lumbar spine 06/30/2019

CLINICAL DATA: Lumbar fusion without significant relief of pain.
Right lower extremity radiculopathy predominantly in an L5
distribution.
TECHNIQUE: Contiguous axial images were obtained through the Lumbar spine after
the intrathecal infusion of infusion. Coronal and sagittal
reconstructions were obtained of the axial image sets.

[w lumbar spine lat]
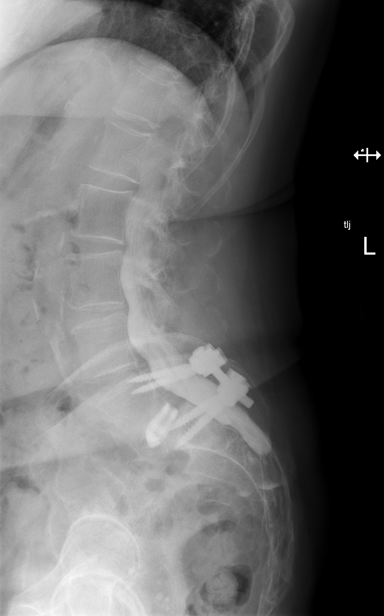

[w lumbar spine flexion]
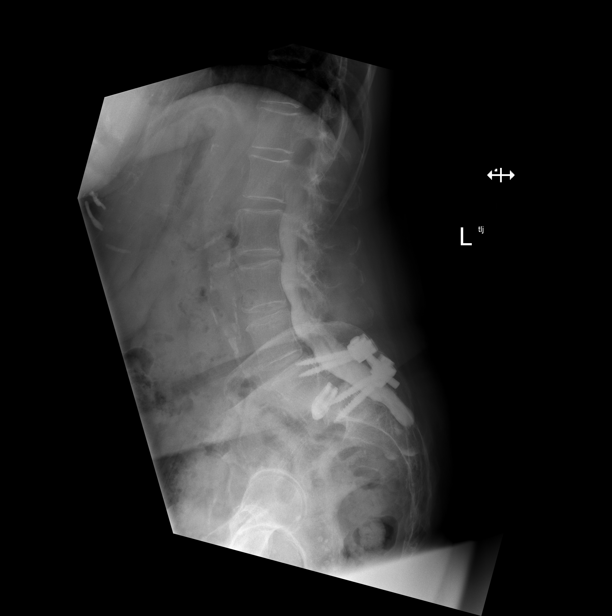

[vasc adipose (1 of 11)]
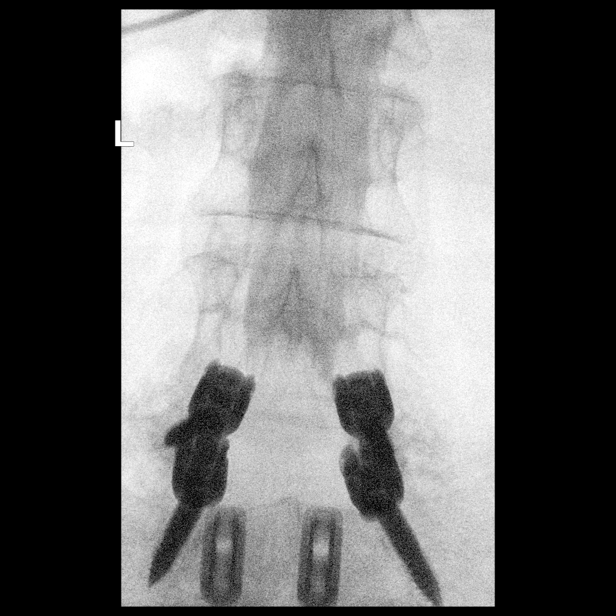

[vasc adipose (2 of 11)]
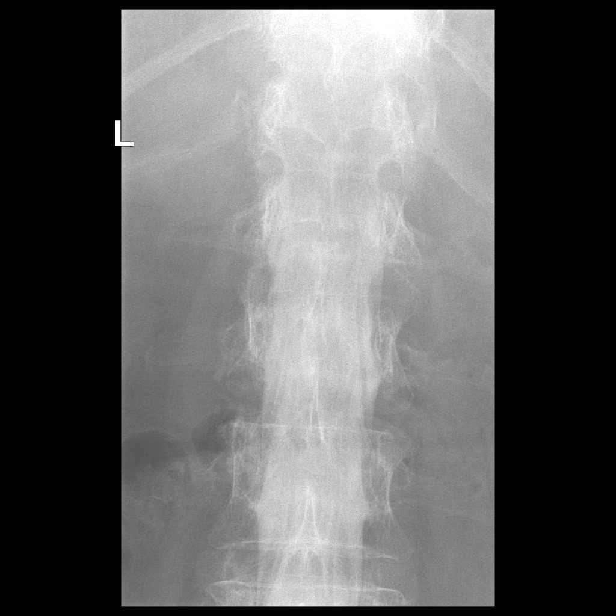

[vasc adipose (3 of 11)]
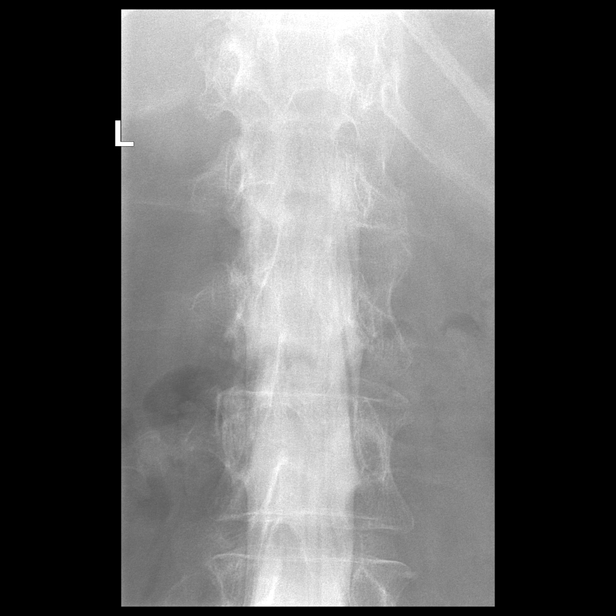

[vasc adipose (4 of 11)]
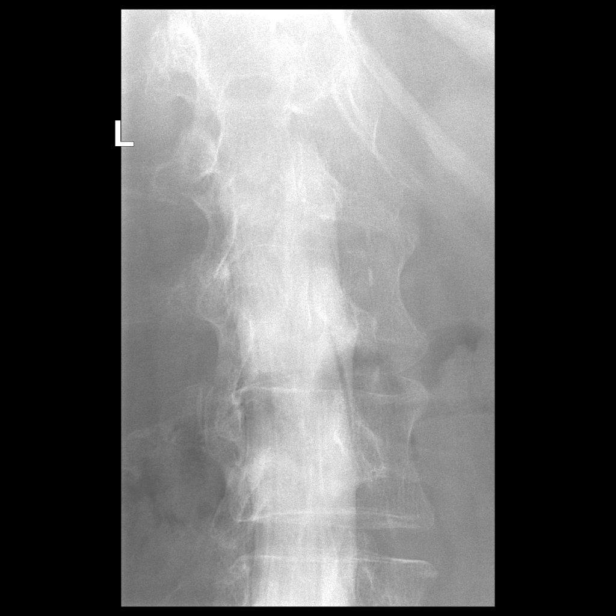

[vasc adipose (5 of 11)]
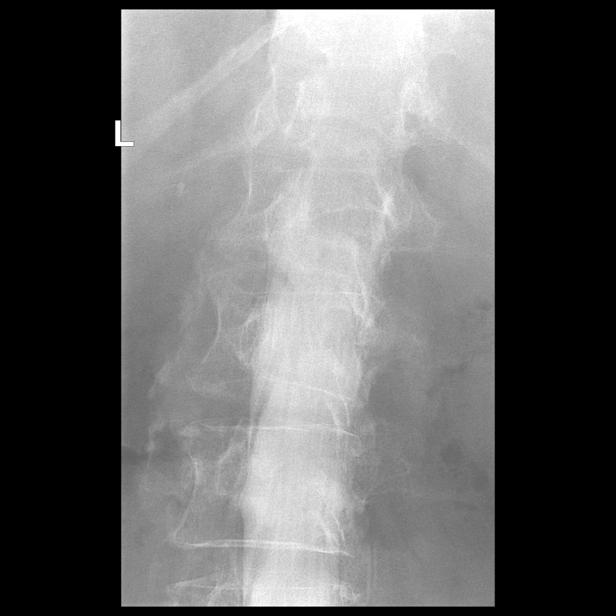

[vasc adipose (6 of 11)]
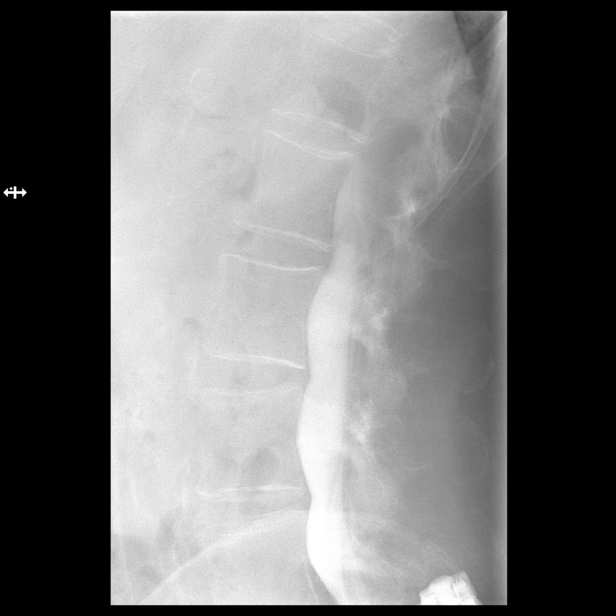

[vasc adipose (7 of 11)]
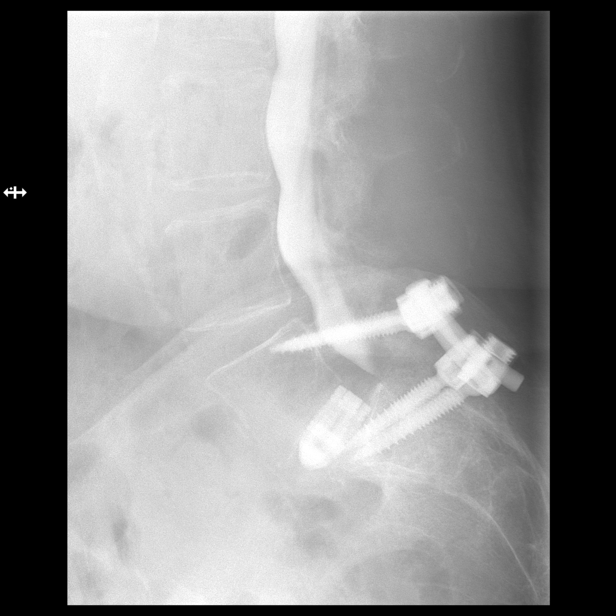

[vasc adipose (8 of 11)]
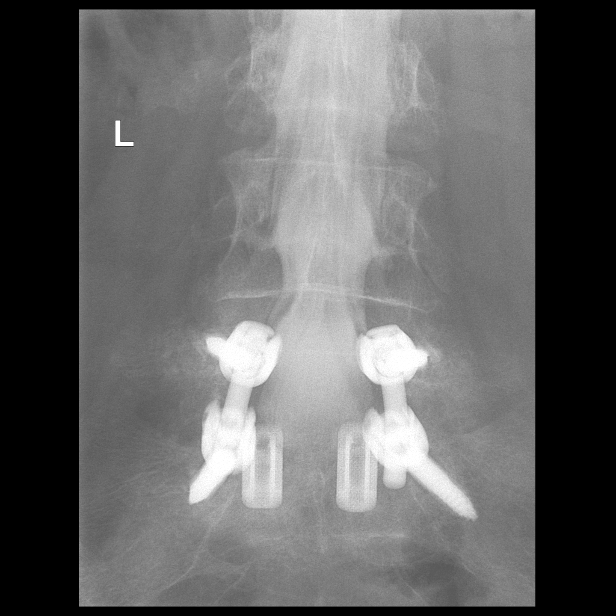

[vasc adipose (9 of 11)]
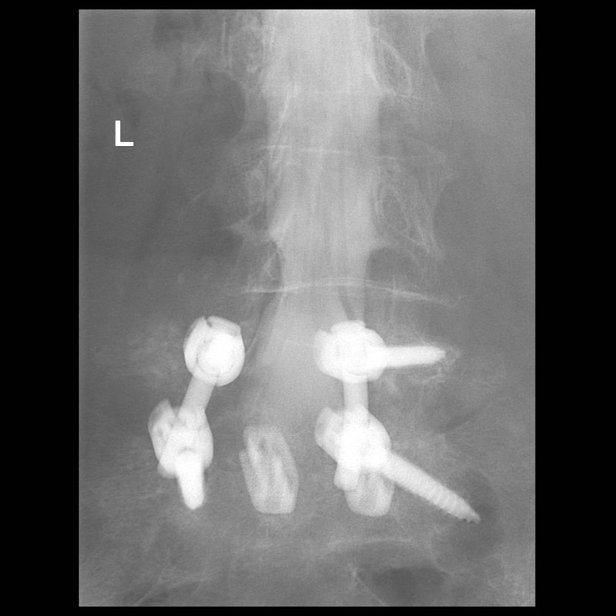

[vasc adipose (10 of 11)]
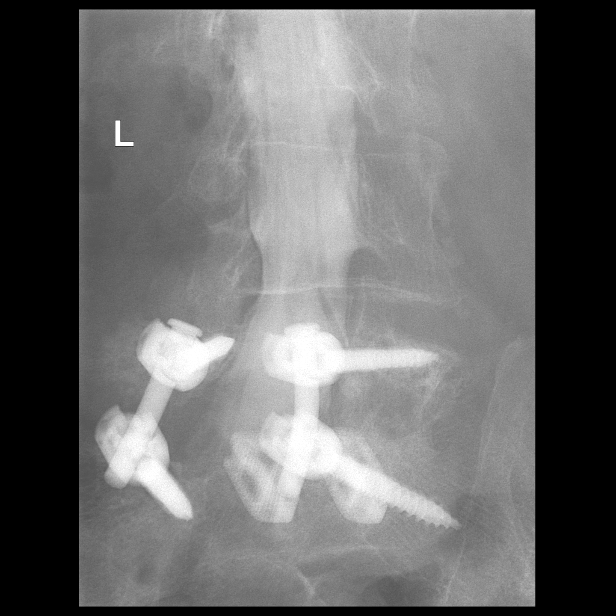

[vasc adipose (11 of 11)]
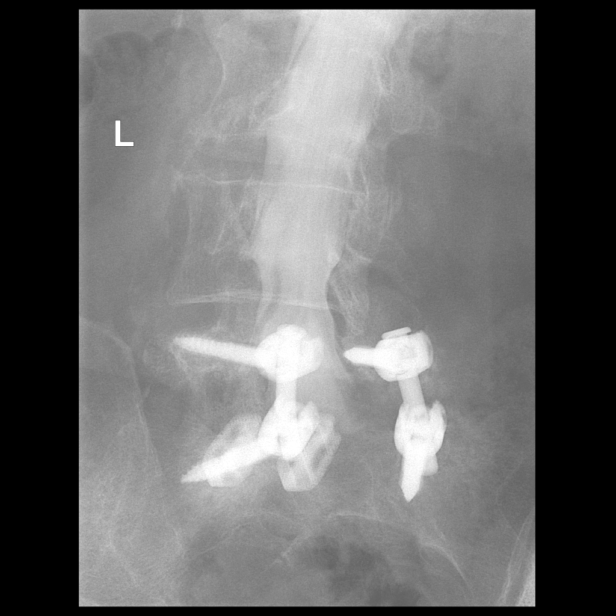

[13 of 18 positions shown; findings below may reference images not displayed]

EXAM:
LUMBAR MYELOGRAM

FLUOROSCOPY TIME:  Radiation Exposure Index (as provided by the
fluoroscopic device): 238.2 uGy*m2

PROCEDURE:
After thorough discussion of risks and benefits of the procedure
including bleeding, infection, injury to nerves, blood vessels,
adjacent structures as well as headache and CSF leak, written and
oral informed consent was obtained. Consent was obtained by Dr.
Enriko Gatto. Time out form was completed.

Patient was positioned prone on the fluoroscopy table. Local
anesthesia was provided with 1% lidocaine without epinephrine after
prepped and draped in the usual sterile fashion. Puncture was
performed at L1-2 using a 3 1/2 inch 22-gauge spinal needle via left
paramedian approach. Using a single pass through the dura, the
needle was placed within the thecal sac, with return of clear CSF.
15 mL of Isovue 8-7RR was injected into the thecal sac, with normal
opacification of the nerve roots and cauda equina consistent with
free flow within the subarachnoid space.

I personally performed the lumbar puncture and administered the
intrathecal contrast. I also personally supervised acquisition of
the myelogram images.
FINDINGS: LUMBAR MYELOGRAM FINDINGS:

Bilateral pedicle screw and rod fixation is present at L5-S1.
Metallic disc spacer is present. Anterolisthesis is stable. Other
significant listhesis is present. No abnormal motion is present
flexion or extension. Mild disc bulging L4-5 is stable. Nerve roots
fill normally on both sides.

CT LUMBAR MYELOGRAM FINDINGS:

Non rib-bearing type vertebral bodies are present. Bilateral L5 pars
defects are present. Grade 1 anterolisthesis is stable measuring 7
mm. No significant listhesis is present.

Atherosclerotic calcifications are present in the aorta branch
vessels. No aneurysm is present. Benign cyst is present the medial
left kidney. Other solid organ lesions are present. Significant
adenopathy is present.

L1-2: Negative.

L2-3: Negative.

L3-4: Mild disc bulging slight loss of disc height is present. No
significant stenosis is present.

Prominent central disc protrusion demonstrates some progression.
Disc extends further into the foramina bilaterally. Mild foraminal
narrowing is worse on the right.

L5-S1: Pedicle screws are contained within bone. The central canal
is decompressed. Moderate right and mild left residual foraminal
stenosis is present. Disc scratched at lateral disc material extends
into the foramina bilaterally.
IMPRESSION: 1. Interval PLIF at L5-S1
2. Residual moderate right foraminal stenosis could impact the right
L5 nerve root.
3. Progressive central canal stenosis with extension into the
foramina bilaterally at L4-5 could contribute to radicular symptoms.
4. Congenital L5 pars defects with stable anterolisthesis at L5-S1.
5. No other significant disc disease.

## 2022-08-17 ENCOUNTER — Other Ambulatory Visit: Payer: Self-pay | Admitting: Neurological Surgery

## 2022-08-17 DIAGNOSIS — M4317 Spondylolisthesis, lumbosacral region: Secondary | ICD-10-CM

## 2022-08-19 ENCOUNTER — Ambulatory Visit (INDEPENDENT_AMBULATORY_CARE_PROVIDER_SITE_OTHER): Payer: Medicare Other

## 2022-08-19 DIAGNOSIS — M4317 Spondylolisthesis, lumbosacral region: Secondary | ICD-10-CM | POA: Diagnosis not present

## 2022-09-02 ENCOUNTER — Encounter: Payer: Self-pay | Admitting: Rehabilitative and Restorative Service Providers"

## 2022-09-02 ENCOUNTER — Other Ambulatory Visit: Payer: Self-pay

## 2022-09-02 ENCOUNTER — Ambulatory Visit: Payer: Medicare Other | Attending: Neurological Surgery | Admitting: Rehabilitative and Restorative Service Providers"

## 2022-09-02 DIAGNOSIS — M6281 Muscle weakness (generalized): Secondary | ICD-10-CM | POA: Insufficient documentation

## 2022-09-02 DIAGNOSIS — R29898 Other symptoms and signs involving the musculoskeletal system: Secondary | ICD-10-CM | POA: Insufficient documentation

## 2022-09-02 DIAGNOSIS — M5459 Other low back pain: Secondary | ICD-10-CM | POA: Diagnosis not present

## 2022-09-02 DIAGNOSIS — M4316 Spondylolisthesis, lumbar region: Secondary | ICD-10-CM | POA: Diagnosis not present

## 2022-09-02 NOTE — Therapy (Signed)
OUTPATIENT PHYSICAL THERAPY THORACOLUMBAR EVALUATION   Patient Name: Ayasha Ellingsen MRN: [id number] DOB:November 10, 1950, 72 y.o., female Today's Date: 09/02/2022  END OF SESSION:  PT End of Session - 09/02/22 1204     Visit Number 1    Number of Visits 16    Date for PT Re-Evaluation 10/28/22    PT Start Time 0930    PT Stop Time 9381    PT Time Calculation (min) 45 min    Activity Tolerance Patient tolerated treatment well             Past Medical History:  Diagnosis Date   Anemia    Arthritis    Cancer ([place]) 2010   Breast   Diabetes mellitus without complication ([place])    Fatty liver    Fibromyalgia    GERD (gastroesophageal reflux disease)    Headache    migraines - takes Atenolol to prevent    Heart murmur    states she has not had an echo nor does she see a cardiologist   Past Surgical History:  Procedure Laterality Date   APPENDECTOMY  1972   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   EYE SURGERY Bilateral 2017   cataracts   MASTECTOMY Bilateral 2010   TONSILLECTOMY     removed when a child   [place]   Patient Active Problem List   Diagnosis Date Noted   S/P lumbar fusion 09/07/2019   Cervical spondylosis without myelopathy 04/10/2018   Depression with anxiety 03/07/2014   Malignant neoplasm of upper-outer quadrant of right female breast ([place]) 08/09/2011   Depression 07/19/2011    PCP: Dr Elenor Quinones  REFERRING PROVIDER: Dr Eustace Moore   REFERRING DIAG: Lumbar Spondylolisthesis  Rationale for Evaluation and Treatment: Rehabilitation  THERAPY DIAG:  Spondylolisthesis of lumbar region  Other low back pain  Other symptoms and signs involving the musculoskeletal system  Muscle weakness (generalized)  ONSET DATE: 01/28/22  SUBJECTIVE:                                                                                                                                                                                            SUBJECTIVE STATEMENT: Patient reports that she has back pain in the Rt LB for the past 6 months or more with symptoms gradually increasing. She has pain into the Lt groin for the past ~6 weeks. She has no known injury. She has no radicular symptoms in LE's, which she experienced prior to surgery in 2020.  PERTINENT HISTORY:  Lumbar and cervical spine surgeries; cervical surgery ~ 5 years ago due  to neck pain with pain into both arms - good resolution of neck symptoms; lumbar fusion 2020 with "cage";  fall with fracture Lt wrist, scapula and dislocation of Lt GH joint ~ 1 year ago; ADOM; HTN; arthritis; psoriatic arthritis; osteoporosis   PAIN:  Are you having pain? Yes: NPRS scale: 8/10 Pain location: low back  Pain description: aching Aggravating factors: could be anything - worst moving sit >< stand; bending; getting in and out of car Relieving factors: time; lying down; medication   PRECAUTIONS: None  WEIGHT BEARING RESTRICTIONS: No  FALLS:  Has patient fallen in last 6 months? 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 Posterior and interbody fusion at the L5-S1 level. No findings to suggest canal stenosis. At least moderate right and mild left foraminal stenosis at this level. 2. Mild disc bulge at L4-L5 with possible mild left foraminal stenosis. 3. Aortic atherosclerosis (ICD10-I70.0).  PATIENT SURVEYS:  FOTO 42; goal 37  SCREENING FOR RED FLAGS: Bowel or bladder incontinence: No   COGNITION: Overall cognitive status: Within functional limits for tasks  assessed     SENSATION: WFL  MUSCLE LENGTH: Hamstrings: Right 65 deg; Left 55 deg   POSTURE: rounded shoulders, forward head, decreased lumbar lordosis, increased thoracic kyphosis, and flexed trunk   PALPATION: Muscular tightness Lt posterior hip through gluts, piriformis   LUMBAR ROM:   AROM eval  Flexion 65% pulling   Extension 20%  Right lateral flexion 65%  Left lateral flexion 50% discomfort Lt LB   Right rotation 20%  Left rotation 15%   (Blank rows = not tested)  LOWER EXTREMITY ROM:     LE ROM is WFL's throughout   LOWER EXTREMITY MMT:    MMT Right eval Left eval  Hip flexion 4+ 4+  Hip extension 4- 4  Hip abduction 4 4+  Hip adduction    Hip internal rotation    Hip external rotation    Knee flexion 5 5  Knee extension 4+ 4+  Ankle dorsiflexion    Ankle plantarflexion    Ankle inversion    Ankle eversion     (Blank rows = not tested)  LUMBAR SPECIAL TESTS:  Straight leg raise test: Negative and Slump test: Negative  FUNCTIONAL TESTS:  Unable to achieve SLS without UE support   GAIT: Distance walked: 40 Assistive device utilized: None Level of assistance: Complete Independence Comments: wide based support with decreased stance phase Lt LE; decreased balance   TODAY'S TREATMENT:  [facility] Adult PT Treatment:                                                DATE: 09/02/22 Therapeutic Exercise: Prone prop 30 sec x 1; ~ 1 min x 1  Transverse abdominal contraction engaging pelvic floor and obliques avoiding posterior pelvic tilt - focus on tightening 10 sec x 10  Trial of bridge increased pain  Hip abduction in hooklying green TB above knees alternating LE 5 sec hold x 5 Rt/LT Wall squat 5-10 sec hold x 5 chair at side for UE support as needed/security   Self Care: To avoid sitting with legs/ankles crossed     PATIENT  EDUCATION:  Education details: POC; HEP  Person educated: Patient Education method: Explanation, Demonstration, Tactile cues, Verbal cues, and Handouts Education comprehension: verbalized understanding, returned demonstration, verbal cues required, tactile cues required, and needs further education  HOME EXERCISE PROGRAM: Access Code: [email or web address] URL: [email or web address] Date: 09/02/2022 Prepared by: Gillermo Murdoch  Exercises - Prone Press Up On Elbows  - 2 x daily - 7 x weekly - 1 sets - 3 reps - 30 sec  hold - Supine Transversus Abdominis Bracing with Pelvic Floor Contraction  - 2 x daily - 7 x weekly - 1 sets - 10 reps - 10sec  hold - Hooklying Isometric Clamshell  - 2 x daily - 7 x weekly - 1 sets - 10 reps - 3 sec  hold - Wall Quarter Squat  - 2 x daily - 7 x weekly - 1-2 sets - 10 reps - 5-10 sec  hold  ASSESSMENT:  CLINICAL IMPRESSION: Patient is a 72 y.o. female who was seen today for physical therapy evaluation and treatment for Rt lower back pain x ~ 6 months and Lt hip to groin pain ~ 6 weeks with no known injury. Patient has pain in the Rt lumbar and Lt posterior-lateral hip to groin area; limited trunk ROM/mobility; decreased core and LE strength; difficulty with transfers; unsteady gait; limited functional activity level; sedentary lifestyle. Patient will benefit from PT to address problems identified.    OBJECTIVE IMPAIRMENTS: decreased activity tolerance, decreased balance, decreased endurance, decreased mobility, difficulty walking, decreased ROM, decreased strength, hypomobility, increased muscle spasms, impaired flexibility, improper body mechanics, postural dysfunction, and pain.   ACTIVITY LIMITATIONS: carrying, lifting, bending, sitting, standing, squatting, stairs, transfers, bed mobility, and locomotion level  PARTICIPATION LIMITATIONS: meal prep, cleaning, laundry, shopping, community activity, and yard work  PERSONAL FACTORS: Age, Fitness, Past/current  experiences, Profession, Time since onset of injury/illness/exacerbation, and comorbidities: prior lumbar and cervical fusions; psoriatic arthritis; arthritis; HTN; sedentary lifestyle are also affecting patient's functional outcome.   REHAB POTENTIAL: Good  CLINICAL DECISION MAKING: Stable/uncomplicated  EVALUATION COMPLEXITY: Low   GOALS: Goals reviewed with patient? Yes  SHORT TERM GOALS: Target date: 09/30/2022   Patient independent in initial HEP  Baseline: Goal status: INITIAL  2.  Patient demonstrates and reports improved ability to move from sit to stand and stand to sit  Baseline:  Goal status: INITIAL    LONG TERM GOALS: Target date: 10/28/2022   Increase lumbar mobility and ROM to WFL's with minimal to no pain  Baseline:  Goal status: INITIAL  2.  Increase strength bilat LE's to 4+/5 to 5/5 throughout Baseline:  Goal status: INITIAL  3.  Decrease pain Rt LB Lt hip/groin 75% allowing  patient to return to more normal functional activities  Baseline:  Goal status: INITIAL  4.  Patient demonstrates and reports independent and safe transfers from sit to stand and stand to sit as well as all bed mobility Baseline:  Goal status: INITIAL  5.  Independent in HEP including aquatic program as indicated  Baseline:  Goal status: INITIAL  6.  Improve functional limitation score to 57 Baseline: 42 Goal status: INITIAL  PLAN:  PT FREQUENCY: 2x/week  PT DURATION: 8 weeks  PLANNED INTERVENTIONS: Therapeutic exercises, Therapeutic activity, Neuromuscular re-education, Balance training, Gait training, Patient/Family education, Self Care, Joint mobilization, Aquatic Therapy, Dry Needling, Electrical stimulation, Cryotherapy, Moist heat, Taping, Ultrasound, Ionotophoresis '4mg'$ /ml Dexamethasone, Manual therapy, and Re-evaluation.  PLAN FOR NEXT SESSION: review and progress exercises; assess 5 times sit to stand; core stabilization and strengthening; balance activities;  manual work, DN, modalities as indicated (may check on TENS for home to help manage pain)    Sayge Brienza Nilda Simmer, PT 09/02/2022, 12:08 PM

## 2022-09-07 ENCOUNTER — Ambulatory Visit: Payer: Medicare Other

## 2022-09-07 DIAGNOSIS — M5459 Other low back pain: Secondary | ICD-10-CM | POA: Diagnosis not present

## 2022-09-07 DIAGNOSIS — M4316 Spondylolisthesis, lumbar region: Secondary | ICD-10-CM

## 2022-09-07 DIAGNOSIS — M6281 Muscle weakness (generalized): Secondary | ICD-10-CM

## 2022-09-07 DIAGNOSIS — R29898 Other symptoms and signs involving the musculoskeletal system: Secondary | ICD-10-CM

## 2022-09-07 NOTE — Therapy (Signed)
OUTPATIENT PHYSICAL THERAPY THORACOLUMBAR TREATMENT   Patient Name: Margaret Moody MRN: [id number] DOB:1951-06-12, 72 y.o., female Today's Date: 09/07/2022  END OF SESSION:  PT End of Session - 09/07/22 1014     Visit Number 2    Number of Visits 16    Date for PT Re-Evaluation 10/28/22    PT Start Time 7322    PT Stop Time 1100    PT Time Calculation (min) 45 min    Activity Tolerance Patient tolerated treatment well    Behavior During Therapy WFL for tasks assessed/performed             Past Medical History:  Diagnosis Date   Anemia    Arthritis    Cancer ([place]) 2010   Breast   Diabetes mellitus without complication ([place])    Fatty liver    Fibromyalgia    GERD (gastroesophageal reflux disease)    Headache    migraines - takes Atenolol to prevent    Heart murmur    states she has not had an echo nor does she see a cardiologist   Past Surgical History:  Procedure Laterality Date   APPENDECTOMY  1972   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   EYE SURGERY Bilateral 2017   cataracts   MASTECTOMY Bilateral 2010   TONSILLECTOMY     removed when a child   [place]   Patient Active Problem List   Diagnosis Date Noted   S/P lumbar fusion 09/07/2019   Cervical spondylosis without myelopathy 04/10/2018   Depression with anxiety 03/07/2014   Malignant neoplasm of upper-outer quadrant of right female breast ([place]) 08/09/2011   Depression 07/19/2011    PCP: Dr Elenor Quinones  REFERRING PROVIDER: Dr Eustace Moore   REFERRING DIAG: Lumbar Spondylolisthesis  Rationale for Evaluation and Treatment: Rehabilitation  THERAPY DIAG:  Spondylolisthesis of lumbar region  Other low back pain  Other symptoms and signs involving the musculoskeletal system  Muscle weakness (generalized)  ONSET DATE: 01/28/22  SUBJECTIVE:                                                                                                                                                                                            SUBJECTIVE STATEMENT: Patient reports she has no pain when standing still but it increases to to 8/10 when bending forward. Patient states she did not get a chance to review HEP due to surprise out-of-town visitors.   PERTINENT HISTORY:  Lumbar and cervical spine surgeries; cervical surgery ~ 5 years ago due to neck pain with pain into both arms -  good resolution of neck symptoms; lumbar fusion 2020 with "cage";  fall with fracture Lt wrist, scapula and dislocation of Lt GH joint ~ 1 year ago; ADOM; HTN; arthritis; psoriatic arthritis; osteoporosis   PAIN:  Are you having pain? Yes: NPRS scale: 8/10 Pain location: low back  Pain description: aching Aggravating factors: could be anything - worst moving sit >< stand; bending; getting in and out of car Relieving factors: time; lying down; medication   PRECAUTIONS: None  WEIGHT BEARING RESTRICTIONS: No  FALLS:  Has patient fallen in last 6 months? 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 Posterior and interbody fusion at the L5-S1 level. No findings to suggest canal stenosis. At least moderate right and mild left foraminal stenosis at this level. 2. Mild disc bulge at L4-L5 with possible mild left foraminal stenosis. 3. Aortic atherosclerosis (ICD10-I70.0).  PATIENT SURVEYS:  FOTO 42; goal 15  SCREENING FOR RED FLAGS: Bowel or bladder incontinence: No   COGNITION: Overall cognitive status: Within functional limits for tasks assessed     SENSATION: WFL  MUSCLE LENGTH: Hamstrings: Right 65  deg; Left 55 deg   POSTURE: rounded shoulders, forward head, decreased lumbar lordosis, increased thoracic kyphosis, and flexed trunk   PALPATION: Muscular tightness Lt posterior hip through gluts, piriformis   LUMBAR ROM:   AROM eval  Flexion 65% pulling   Extension 20%  Right lateral flexion 65%  Left lateral flexion 50% discomfort Lt LB   Right rotation 20%  Left rotation 15%   (Blank rows = not tested)  LOWER EXTREMITY ROM:     LE ROM is WFL's throughout   LOWER EXTREMITY MMT:    MMT Right eval Left eval  Hip flexion 4+ 4+  Hip extension 4- 4  Hip abduction 4 4+  Hip adduction    Hip internal rotation    Hip external rotation    Knee flexion 5 5  Knee extension 4+ 4+  Ankle dorsiflexion    Ankle plantarflexion    Ankle inversion    Ankle eversion     (Blank rows = not tested)  LUMBAR SPECIAL TESTS:  Straight leg raise test: Negative and Slump test: Negative  FUNCTIONAL TESTS:  From Eval: Unable to achieve SLS without UE support  09/07/22: STSx5 - 35 sec (hands braced on thighs)  GAIT: Distance walked: 40 Assistive device utilized: None Level of assistance: Complete Independence Comments: wide based support with decreased stance phase Lt LE; decreased balance   TODAY'S TREATMENT:       [facility] Adult PT Treatment:                                                DATE: 09/07/2022 Therapeutic Exercise: Prone prop on elbows x 110mn Hooklying ball squeeze + TA activation x10 Bent knee fall out GTB 2x10 (towel roll at lumbar) Core marching x20 HS stretch w/strap 3x30" B Bridge w/pelvis on 1/2 FR x10, x5 no FR STS 2x5  (35 sec) Counter:  Side stepping RTB x4L Bent over hip extension 2x10                                                                                                                           [facility] Adult PT Treatment:                                                DATE: 09/02/22 Therapeutic Exercise: Prone prop 30 sec x 1; ~ 1 min x 1  Transverse  abdominal contraction engaging pelvic floor and obliques avoiding posterior pelvic tilt - focus on tightening 10 sec x 10  Trial of bridge increased pain  Hip abduction in hooklying green TB above knees alternating LE 5 sec hold x 5 Rt/LT Wall squat 5-10 sec hold x 5 chair at side for UE support as needed/security   Self Care: To avoid sitting with legs/ankles crossed     PATIENT EDUCATION:  Education details: Review,/Progress HEP  Person educated: Patient Education method: Explanation, Demonstration, Tactile cues, Verbal cues, and Handouts Education comprehension: verbalized understanding, returned demonstration, verbal cues required, tactile cues required, and needs further education  HOME EXERCISE PROGRAM: Access Code: [email or web address] URL: [email or web address] Date: 09/07/2022 Prepared by: Helane Gunther  Exercises - Prone Press Up On Elbows  - 2 x daily - 7 x weekly - 1 sets - 3 reps - 30 sec  hold - Supine Transversus Abdominis Bracing with Pelvic Floor Contraction  - 2 x daily - 7 x weekly - 1 sets - 10 reps - 10sec  hold - Hooklying Isometric Clamshell  - 2 x daily - 7 x weekly - 1 sets - 10 reps - 3 sec  hold - Wall Quarter Squat  - 2 x daily - 7 x weekly - 1-2 sets - 10 reps - 5-10 sec  hold - Prone Hip Extension on Table  - 1 x daily - 7 x weekly - 3 sets - 10 reps - Sit to Stand with Hands on Knees  - 1 x daily - 7 x weekly - 3 sets - 10 reps - Supine March  - 1 x daily - 7 x weekly - 3 sets - 10 reps  ASSESSMENT:  CLINICAL IMPRESSION: Moderate pelvic instability exhibited during unilateral hip abduction in hooklying; tactile cues and towel roll for lumbar support improved postural support. Verbal cues decreased overcompensation with posterior pelvic tilt during bridging exercise. Modifying standing hip extension with supported forward flexion decreased lumbar discomfort.   OBJECTIVE IMPAIRMENTS: decreased activity tolerance, decreased balance, decreased  endurance, decreased mobility, difficulty walking, decreased ROM, decreased strength, hypomobility, increased muscle spasms, impaired flexibility, improper body mechanics, postural dysfunction, and pain.   ACTIVITY LIMITATIONS: carrying, lifting, bending, sitting, standing, squatting, stairs, transfers, bed mobility, and locomotion level  PARTICIPATION LIMITATIONS: meal prep, cleaning, laundry, shopping, community activity, and yard work  PERSONAL FACTORS: Age, Fitness, Past/current experiences, Profession, Time since onset of injury/illness/exacerbation, and comorbidities: prior lumbar and cervical fusions; psoriatic arthritis; arthritis; HTN; sedentary lifestyle are also affecting patient's functional outcome.   REHAB POTENTIAL: Good  CLINICAL DECISION MAKING: Stable/uncomplicated  EVALUATION COMPLEXITY: Low   GOALS: Goals reviewed with patient? Yes  SHORT TERM GOALS: Target date: 09/30/2022   Patient independent in initial HEP  Baseline: Goal status: INITIAL  2.  Patient demonstrates and reports improved ability to move from sit to stand and stand to sit  Baseline:  Goal status: INITIAL    LONG TERM GOALS: Target date: 10/28/2022   Increase lumbar mobility and ROM to WFL's with minimal to no pain  Baseline:  Goal status: INITIAL  2.  Increase strength bilat LE's to 4+/5 to 5/5 throughout Baseline:  Goal status: INITIAL  3.  Decrease pain Rt LB Lt hip/groin 75% allowing patient to return to more normal functional activities  Baseline:  Goal status: INITIAL  4.  Patient demonstrates and reports independent and safe transfers from sit to stand and stand to sit as well as all bed mobility Baseline:  Goal status: INITIAL  5.  Independent in HEP including aquatic program as indicated  Baseline:  Goal status: INITIAL  6.  Improve functional limitation score to 57 Baseline: 42 Goal status: INITIAL  PLAN:  PT FREQUENCY: 2x/week  PT DURATION: 8 weeks  PLANNED  INTERVENTIONS: Therapeutic exercises, Therapeutic activity, Neuromuscular re-education, Balance training, Gait training, Patient/Family education, Self Care, Joint mobilization, Aquatic Therapy, Dry Needling, Electrical stimulation, Cryotherapy, Moist heat, Taping, Ultrasound, Ionotophoresis '4mg'$ /ml Dexamethasone, Manual therapy, and Re-evaluation.  PLAN FOR NEXT SESSION: Progress exercises; core stabilization and strengthening; balance activities; manual work, DN, modalities as indicated (may check on TENS for home to help manage pain)    Hardin Negus, PTA 09/07/2022, 11:02 AM

## 2022-09-09 ENCOUNTER — Ambulatory Visit: Payer: Medicare Other

## 2022-09-09 DIAGNOSIS — M5459 Other low back pain: Secondary | ICD-10-CM | POA: Diagnosis not present

## 2022-09-09 DIAGNOSIS — R29898 Other symptoms and signs involving the musculoskeletal system: Secondary | ICD-10-CM

## 2022-09-09 DIAGNOSIS — M4316 Spondylolisthesis, lumbar region: Secondary | ICD-10-CM

## 2022-09-09 DIAGNOSIS — M6281 Muscle weakness (generalized): Secondary | ICD-10-CM

## 2022-09-09 NOTE — Therapy (Signed)
OUTPATIENT PHYSICAL THERAPY THORACOLUMBAR TREATMENT   Patient Name: Margaret Moody MRN: [id number] DOB:09/27/1950, 72 y.o., female Today's Date: 09/09/2022  END OF SESSION:  PT End of Session - 09/09/22 1401     Visit Number 3    Number of Visits 16    Date for PT Re-Evaluation 10/28/22    PT Start Time 1400    PT Stop Time 1443    PT Time Calculation (min) 43 min    Activity Tolerance Patient tolerated treatment well    Behavior During Therapy WFL for tasks assessed/performed             Past Medical History:  Diagnosis Date   Anemia    Arthritis    Cancer ([place]) 2010   Breast   Diabetes mellitus without complication ([place])    Fatty liver    Fibromyalgia    GERD (gastroesophageal reflux disease)    Headache    migraines - takes Atenolol to prevent    Heart murmur    states she has not had an echo nor does she see a cardiologist   Past Surgical History:  Procedure Laterality Date   APPENDECTOMY  1972   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   EYE SURGERY Bilateral 2017   cataracts   MASTECTOMY Bilateral 2010   TONSILLECTOMY     removed when a child   [place]   Patient Active Problem List   Diagnosis Date Noted   S/P lumbar fusion 09/07/2019   Cervical spondylosis without myelopathy 04/10/2018   Depression with anxiety 03/07/2014   Malignant neoplasm of upper-outer quadrant of right female breast ([place]) 08/09/2011   Depression 07/19/2011    PCP: Dr Elenor Quinones  REFERRING PROVIDER: Dr Eustace Moore   REFERRING DIAG: Lumbar Spondylolisthesis  Rationale for Evaluation and Treatment: Rehabilitation  THERAPY DIAG:  Spondylolisthesis of lumbar region  Other low back pain  Other symptoms and signs involving the musculoskeletal system  Muscle weakness (generalized)  ONSET DATE: 01/28/22  SUBJECTIVE:                                                                                                                                                                                            SUBJECTIVE STATEMENT: Patient states she was feeling better but the weather on Tuesday affected her body. Patient states she had mild soreness down back of legs after last visit.   PERTINENT HISTORY:  Lumbar and cervical spine surgeries; cervical surgery ~ 5 years ago due to neck pain with pain into both arms - good resolution of neck symptoms; lumbar fusion  2020 with "cage";  fall with fracture Lt wrist, scapula and dislocation of Lt GH joint ~ 1 year ago; ADOM; HTN; arthritis; psoriatic arthritis; osteoporosis   PAIN:  Are you having pain? Yes: NPRS scale: 8/10 Pain location: low back  Pain description: aching Aggravating factors: could be anything - worst moving sit >< stand; bending; getting in and out of car Relieving factors: time; lying down; medication   PRECAUTIONS: None  WEIGHT BEARING RESTRICTIONS: No  FALLS:  Has patient fallen in last 6 months? 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 Posterior and interbody fusion at the L5-S1 level. No findings to suggest canal stenosis. At least moderate right and mild left foraminal stenosis at this level. 2. Mild disc bulge at L4-L5 with possible mild left foraminal stenosis. 3. Aortic atherosclerosis (ICD10-I70.0).  PATIENT SURVEYS:  FOTO 42; goal 38  SCREENING FOR RED FLAGS: Bowel or bladder incontinence: No   COGNITION: Overall cognitive status: Within functional limits for tasks assessed     SENSATION: WFL  MUSCLE LENGTH: Hamstrings: Right 65 deg; Left 55 deg   POSTURE: rounded  shoulders, forward head, decreased lumbar lordosis, increased thoracic kyphosis, and flexed trunk   PALPATION: Muscular tightness Lt posterior hip through gluts, piriformis   LUMBAR ROM:   AROM eval  Flexion 65% pulling   Extension 20%  Right lateral flexion 65%  Left lateral flexion 50% discomfort Lt LB   Right rotation 20%  Left rotation 15%   (Blank rows = not tested)  LOWER EXTREMITY ROM:     LE ROM is WFL's throughout   LOWER EXTREMITY MMT:    MMT Right eval Left eval  Hip flexion 4+ 4+  Hip extension 4- 4  Hip abduction 4 4+  Hip adduction    Hip internal rotation    Hip external rotation    Knee flexion 5 5  Knee extension 4+ 4+  Ankle dorsiflexion    Ankle plantarflexion    Ankle inversion    Ankle eversion     (Blank rows = not tested)  LUMBAR SPECIAL TESTS:  Straight leg raise test: Negative and Slump test: Negative  FUNCTIONAL TESTS:  From Eval: Unable to achieve SLS without UE support  09/07/22: STSx5 - 35 sec (hands braced on thighs)  GAIT: Distance walked: 40 Assistive device utilized: None Level of assistance: Complete Independence Comments: wide based support with decreased stance phase Lt LE; decreased balance   TODAY'S TREATMENT:   [facility] Adult PT Treatment:                                                DATE: 09/09/2022 Therapeutic Exercise: Prone prop on elbows (dynamic) Hooklying hip add isometric ball squeeze 12x5" Hooklying hip abd isometric belt press 12x5" Bridges w/ isometric hip abd (belt press) x10 Bent knee fall out GTB 2x12 Prone quad stretch w/strap 3x30" HS stretch with strap 3x30" pilates single leg circles S/L straight leg hip abd --> small circles  [facility] Adult PT Treatment:                                                DATE: 09/07/2022 Therapeutic Exercise: Prone prop on elbows x 70mn Hooklying ball squeeze + TA activation x10 Bent knee fall out GTB 2x10 (towel roll at lumbar) Core marching x20 HS stretch  w/strap 3x30" B Bridge w/pelvis on 1/2 FR x10, x5 no FR STS 2x5  (35 sec) Counter: Side stepping RTB x4L Bent over hip extension 2x10                                                                                                                       PATIENT EDUCATION:  Education details: Stretching LEs prior to bed for restless leg Person educated: Patient Education method: Explanation, Demonstration, Tactile cues, Verbal cues, and Handouts Education comprehension: verbalized understanding, returned demonstration, verbal cues required, tactile cues required, and needs further education  HOME EXERCISE PROGRAM: Access Code: [email or web address]: [email or web address] Date: 09/09/2022 Prepared by: KHelane Gunther Exercises - Prone Press Up On Elbows  - 2 x daily - 7 x weekly - 1 sets - 3 reps - 30 sec  hold - Supine Transversus Abdominis Bracing with Pelvic Floor Contraction  - 2 x daily - 7 x weekly - 1 sets - 10 reps - 10sec  hold - Hooklying Isometric Clamshell  - 2 x daily - 7 x weekly - 1 sets - 10 reps - 3 sec  hold - Wall Quarter Squat  - 2 x daily - 7 x weekly - 1-2 sets - 10 reps - 5-10 sec  hold - Prone Hip Extension on Table  - 1 x daily - 7 x weekly - 3 sets - 10 reps - Sit to Stand with Hands on Knees  - 1 x daily - 7 x weekly - 3 sets - 10 reps - Supine March  - 1 x daily - 7 x weekly - 3 sets - 10 reps - Hooklying Hamstring Stretch with Strap  - 2 x daily - 7 x weekly - 1 sets - 3 reps - 30 sec hold - Prone Quadriceps Stretch with Strap  - 2 x daily - 7 x weekly - 1 sets - 3 reps - 30 sec hold  ASSESSMENT:  CLINICAL IMPRESSION: Patient demonstrated slight improvement in pelvic stabilization during bridges and side lying hip abd exercises. Dynamic leg circles added to challenge pelvic and core stabilization and strength. Tactile cues improved body mechanics during single leg circles.  OBJECTIVE IMPAIRMENTS: decreased activity tolerance, decreased balance,  decreased endurance, decreased mobility, difficulty walking, decreased ROM, decreased strength, hypomobility, increased muscle spasms, impaired flexibility, improper body mechanics, postural dysfunction, and pain.   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   REHAB POTENTIAL: Good  CLINICAL DECISION MAKING: Stable/uncomplicated  EVALUATION COMPLEXITY: Low   GOALS: Goals reviewed with patient? Yes  SHORT TERM GOALS: Target date: 09/30/2022   Patient independent in initial HEP  Baseline: Goal status: INITIAL  2.  Patient demonstrates and reports improved ability to move from sit to stand and stand to sit  Baseline:  Goal status: INITIAL    LONG TERM GOALS: Target date: 10/28/2022   Increase lumbar mobility and ROM to WFL's with minimal to no pain  Baseline:  Goal status: INITIAL  2.  Increase strength bilat LE's to 4+/5 to 5/5 throughout Baseline:  Goal status: INITIAL  3.  Decrease pain Rt LB Lt hip/groin 75% allowing patient to return to more normal functional activities  Baseline:  Goal status: INITIAL  4.  Patient demonstrates and reports independent and safe transfers from sit to stand and stand to sit as well as all bed mobility Baseline:  Goal status: INITIAL  5.  Independent in HEP including aquatic program as indicated  Baseline:  Goal status: INITIAL  6.  Improve functional limitation score to 57 Baseline: 42 Goal status: INITIAL  PLAN:  PT FREQUENCY: 2x/week  PT DURATION: 8  weeks  PLANNED INTERVENTIONS: Therapeutic exercises, Therapeutic activity, Neuromuscular re-education, Balance training, Gait training, Patient/Family education, Self Care, Joint mobilization, Aquatic Therapy, Dry Needling, Electrical stimulation, Cryotherapy, Moist heat, Taping, Ultrasound, Ionotophoresis '4mg'$ /ml Dexamethasone, Manual therapy, and Re-evaluation.  PLAN FOR NEXT SESSION: Progress exercises; core stabilization and strengthening; balance activities; manual work, DN, modalities as indicated (trial TENS if interested)    Hardin Negus, PTA 09/09/2022, 2:44 PM

## 2022-09-14 ENCOUNTER — Ambulatory Visit: Payer: Medicare Other

## 2022-09-17 ENCOUNTER — Ambulatory Visit: Payer: Medicare Other

## 2022-09-17 DIAGNOSIS — M5459 Other low back pain: Secondary | ICD-10-CM | POA: Diagnosis not present

## 2022-09-17 DIAGNOSIS — M6281 Muscle weakness (generalized): Secondary | ICD-10-CM

## 2022-09-17 DIAGNOSIS — R29898 Other symptoms and signs involving the musculoskeletal system: Secondary | ICD-10-CM

## 2022-09-17 DIAGNOSIS — M4316 Spondylolisthesis, lumbar region: Secondary | ICD-10-CM

## 2022-09-17 NOTE — Therapy (Signed)
OUTPATIENT PHYSICAL THERAPY THORACOLUMBAR TREATMENT   Patient Name: Margaret Moody MRN: [id number] DOB:[birth year], 72 y.o., female Today's Date: 09/17/2022  END OF SESSION:  PT End of Session - 09/17/22 1105     Visit Number 4    Number of Visits 16    Date for PT Re-Evaluation 10/28/22    PT Start Time 1104    PT Stop Time 1150    PT Time Calculation (min) 46 min             Past Medical History:  Diagnosis Date   Anemia    Arthritis    Cancer ([place]) 2010   Breast   Diabetes mellitus without complication ([place])    Fatty liver    Fibromyalgia    GERD (gastroesophageal reflux disease)    Headache    migraines - takes Atenolol to prevent    Heart murmur    states she has not had an echo nor does she see a cardiologist   Past Surgical History:  Procedure Laterality Date   APPENDECTOMY  1972   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   EYE SURGERY Bilateral 2017   cataracts   MASTECTOMY Bilateral 2010   TONSILLECTOMY     removed when a child   [place]   Patient Active Problem List   Diagnosis Date Noted   S/P lumbar fusion 09/07/2019   Cervical spondylosis without myelopathy 04/10/2018   Depression with anxiety 03/07/2014   Malignant neoplasm of upper-outer quadrant of right female breast ([place]) 08/09/2011   Depression 07/19/2011    PCP: Dr Elenor Quinones  REFERRING PROVIDER: Dr Eustace Moore   REFERRING DIAG: Lumbar Spondylolisthesis  Rationale for Evaluation and Treatment: Rehabilitation  THERAPY DIAG:  Spondylolisthesis of lumbar region  Other low back pain  Other symptoms and signs involving the musculoskeletal system  Muscle weakness (generalized)  ONSET DATE: 01/28/22  SUBJECTIVE:                                                                                                                                                                                           SUBJECTIVE STATEMENT: Patient reports her restless leg is doing  better; patient states she has been having pain along top of foot and front of ankle.   PERTINENT HISTORY:  Lumbar and cervical spine surgeries; cervical surgery ~ 5 years ago due to neck pain with pain into both arms - good resolution of neck symptoms; lumbar fusion 2020 with "cage";  fall with fracture Lt wrist, scapula and dislocation of Lt GH joint ~ 1 year ago; ADOM; HTN; arthritis;  psoriatic arthritis; osteoporosis   PAIN:  Are you having pain? Yes: NPRS scale: 5/10 Pain location: low back  Pain description: aching Aggravating factors: could be anything - worst moving sit >< stand; bending; getting in and out of car Relieving factors: time; lying down; medication   PRECAUTIONS: None  WEIGHT BEARING RESTRICTIONS: No  FALLS:  Has patient fallen in last 6 months? 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 Posterior and interbody fusion at the L5-S1 level. No findings to suggest canal stenosis. At least moderate right and mild left foraminal stenosis at this level. 2. Mild disc bulge at L4-L5 with possible mild left foraminal stenosis. 3. Aortic atherosclerosis (ICD10-I70.0).  PATIENT SURVEYS:  FOTO 42; goal 15  SCREENING FOR RED FLAGS: Bowel or bladder incontinence: No   COGNITION: Overall cognitive status: Within functional limits for tasks assessed     SENSATION: WFL  MUSCLE LENGTH: Hamstrings: Right 65 deg; Left 55 deg   POSTURE: rounded shoulders, forward head, decreased lumbar lordosis, increased thoracic kyphosis, and flexed trunk   PALPATION: Muscular tightness Lt posterior  hip through gluts, piriformis   LUMBAR ROM:   AROM eval  Flexion 65% pulling   Extension 20%  Right lateral flexion 65%  Left lateral flexion 50% discomfort Lt LB   Right rotation 20%  Left rotation 15%   (Blank rows = not tested)  LOWER EXTREMITY ROM:     LE ROM is WFL's throughout   LOWER EXTREMITY MMT:    MMT Right eval Left eval  Hip flexion 4+ 4+  Hip extension 4- 4  Hip abduction 4 4+  Hip adduction    Hip internal rotation    Hip external rotation    Knee flexion 5 5  Knee extension 4+ 4+  Ankle dorsiflexion    Ankle plantarflexion    Ankle inversion    Ankle eversion     (Blank rows = not tested)  LUMBAR SPECIAL TESTS:  Straight leg raise test: Negative and Slump test: Negative  FUNCTIONAL TESTS:  From Eval: Unable to achieve SLS without UE support  09/07/22: STSx5 - 35 sec (hands braced on thighs)  GAIT: Distance walked: 40 Assistive device utilized: None Level of assistance: Complete Independence Comments: wide based support with decreased stance phase Lt LE; decreased balance   TODAY'S TREATMENT:   [facility] Adult PT Treatment:                                                DATE: 09/17/2022 Therapeutic Exercise: Prone prop on elbows stretch 2x30" Hooklying hip add isometric ball squeeze 12x5" S/L clamshells RTB 2x10 S/L bent knee hip abd RTB 2x10 STS x10 with focus on hip hinge Leg Press: DL 90# 2x10 Prone quad stretch w/strap 3x30" B   [facility] Adult PT Treatment:  DATE: 09/09/2022 Therapeutic Exercise: Prone prop on elbows (dynamic) Hooklying hip add isometric ball squeeze 12x5" Hooklying hip abd isometric belt press 12x5" Bridges w/ isometric hip abd (belt press) x10 Bent knee fall out GTB 2x12 Prone quad stretch w/strap 3x30" HS stretch with strap 3x30" pilates single leg circles S/L straight leg hip abd --> small circles                                                                                                                             PATIENT EDUCATION:  Education details: Updated HEP Person educated: Patient Education method: Explanation, Demonstration, Tactile cues, Verbal cues, and Handouts Education comprehension: verbalized understanding, returned demonstration, verbal cues required, tactile cues required, and needs further education  HOME EXERCISE PROGRAM: Access Code: [email or web address] URL: [email or web address] Date: 09/17/2022 Prepared by: Helane Gunther  Exercises - Prone Press Up On Elbows  - 2 x daily - 7 x weekly - 1 sets - 3 reps - 30 sec  hold - Supine Transversus Abdominis Bracing with Pelvic Floor Contraction  - 2 x daily - 7 x weekly - 1 sets - 10 reps - 10sec  hold - Hooklying Isometric Clamshell  - 2 x daily - 7 x weekly - 1 sets - 10 reps - 3 sec  hold - Wall Quarter Squat  - 2 x daily - 7 x weekly - 1-2 sets - 10 reps - 5-10 sec  hold - Prone Hip Extension on Table  - 1 x daily - 7 x weekly - 3 sets - 10 reps - Sit to Stand with Hands on Knees  - 1 x daily - 7 x weekly - 3 sets - 10 reps - Supine March  - 1 x daily - 7 x weekly - 3 sets - 10 reps - Hooklying Hamstring Stretch with Strap  - 2 x daily - 7 x weekly - 1 sets - 3 reps - 30 sec hold - Prone Quadriceps Stretch with Strap  - 2 x daily - 7 x weekly - 1 sets - 3 reps - 30 sec hold - Clamshell with Resistance  - 1 x daily - 7 x weekly - 3 sets - 10 reps - Sidelying Bent Knee Lift at 45 Degrees  - 1 x daily - 7 x weekly - 3 sets - 10 reps - Beginner Sidelying Leg Circle  - 1 x daily - 7 x weekly - 3 sets - 10 reps  ASSESSMENT:  CLINICAL IMPRESSION: Hip strengthening progressed with side lying variations of hip abduction exercises with and without resistance. Moderate glute fatigue and occasional hamstring occasional exhibited on R LE. Cueing hip hinge body mechanics improved eccentric control during sit to stand.   OBJECTIVE IMPAIRMENTS: decreased activity tolerance, decreased balance,  decreased endurance, decreased mobility, difficulty walking, decreased ROM, decreased strength, hypomobility, increased muscle spasms, impaired flexibility, improper body mechanics, postural dysfunction, and pain.   ACTIVITY LIMITATIONS: carrying, lifting, bending, sitting, standing, squatting, stairs, transfers,  bed mobility, and locomotion level  PARTICIPATION LIMITATIONS: meal prep, cleaning, laundry, shopping, community activity, and yard work  PERSONAL FACTORS: Age, Fitness, Past/current experiences, Profession, Time since onset of injury/illness/exacerbation, and comorbidities: prior lumbar and cervical fusions; psoriatic arthritis; arthritis; HTN; sedentary lifestyle are also affecting patient's functional outcome.   REHAB POTENTIAL: Good  CLINICAL DECISION MAKING: Stable/uncomplicated  EVALUATION COMPLEXITY: Low   GOALS: Goals reviewed with patient? Yes  SHORT TERM GOALS: Target date: 09/30/2022   Patient independent in initial HEP  Baseline: Goal status: INITIAL  2.  Patient demonstrates and reports improved ability to move from sit to stand and stand to sit  Baseline:  Goal status: INITIAL    LONG TERM GOALS: Target date: 10/28/2022   Increase lumbar mobility and ROM to WFL's with minimal to no pain  Baseline:  Goal status: INITIAL  2.  Increase strength bilat LE's to 4+/5 to 5/5 throughout Baseline:  Goal status: INITIAL  3.  Decrease pain Rt LB Lt hip/groin 75% allowing patient to return to more normal functional activities  Baseline:  Goal status: INITIAL  4.  Patient demonstrates and reports independent and safe transfers from sit to stand and stand to sit as well as all bed mobility Baseline:  Goal status: INITIAL  5.  Independent in HEP including aquatic program as indicated  Baseline:  Goal status: INITIAL  6.  Improve functional limitation score to 57 Baseline: 42 Goal status: INITIAL  PLAN:  PT FREQUENCY: 2x/week  PT DURATION: 8  weeks  PLANNED INTERVENTIONS: Therapeutic exercises, Therapeutic activity, Neuromuscular re-education, Balance training, Gait training, Patient/Family education, Self Care, Joint mobilization, Aquatic Therapy, Dry Needling, Electrical stimulation, Cryotherapy, Moist heat, Taping, Ultrasound, Ionotophoresis '4mg'$ /ml Dexamethasone, Manual therapy, and Re-evaluation.  PLAN FOR NEXT SESSION: Progress exercises; core stabilization and strengthening; balance activities; manual work, DN, modalities as indicated (trial TENS if interested)    Hardin Negus, PTA 09/17/2022, 11:55 AM

## 2022-09-21 ENCOUNTER — Ambulatory Visit: Payer: Medicare Other | Admitting: Rehabilitative and Restorative Service Providers"

## 2022-09-21 ENCOUNTER — Encounter: Payer: Self-pay | Admitting: Rehabilitative and Restorative Service Providers"

## 2022-09-21 DIAGNOSIS — M5459 Other low back pain: Secondary | ICD-10-CM | POA: Diagnosis not present

## 2022-09-21 DIAGNOSIS — M6281 Muscle weakness (generalized): Secondary | ICD-10-CM

## 2022-09-21 DIAGNOSIS — M4316 Spondylolisthesis, lumbar region: Secondary | ICD-10-CM

## 2022-09-21 DIAGNOSIS — R29898 Other symptoms and signs involving the musculoskeletal system: Secondary | ICD-10-CM

## 2022-09-21 NOTE — Therapy (Signed)
OUTPATIENT PHYSICAL THERAPY THORACOLUMBAR TREATMENT   Patient Name: Margaret Moody MRN: [id number] DOB:[birth year], 72 y.o., female Today's Date: 09/21/2022  END OF SESSION:  PT End of Session - 09/21/22 1105     Visit Number 5    Number of Visits 16    Date for PT Re-Evaluation 10/28/22    PT Start Time 1105    PT Stop Time 1145    PT Time Calculation (min) 40 min    Activity Tolerance Patient tolerated treatment well             Past Medical History:  Diagnosis Date   Anemia    Arthritis    Cancer ([place]) 2010   Breast   Diabetes mellitus without complication ([place])    Fatty liver    Fibromyalgia    GERD (gastroesophageal reflux disease)    Headache    migraines - takes Atenolol to prevent    Heart murmur    states she has not had an echo nor does she see a cardiologist   Past Surgical History:  Procedure Laterality Date   APPENDECTOMY  1972   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   EYE SURGERY Bilateral 2017   cataracts   MASTECTOMY Bilateral 2010   TONSILLECTOMY     removed when a child   [place]   Patient Active Problem List   Diagnosis Date Noted   S/P lumbar fusion 09/07/2019   Cervical spondylosis without myelopathy 04/10/2018   Depression with anxiety 03/07/2014   Malignant neoplasm of upper-outer quadrant of right female breast ([place]) 08/09/2011   Depression 07/19/2011    PCP: Dr Elenor Quinones  REFERRING PROVIDER: Dr Eustace Moore   REFERRING DIAG: Lumbar Spondylolisthesis  Rationale for Evaluation and Treatment: Rehabilitation  THERAPY DIAG:  Spondylolisthesis of lumbar region  Other low back pain  Other symptoms and signs involving the musculoskeletal system  Muscle weakness (generalized)  ONSET DATE: 01/28/22  SUBJECTIVE:                                                                                                                                                                                            SUBJECTIVE STATEMENT: Has plantar fasciitis in Rt foot as well as some patient states she is still having pain along top of foot and front of ankle. Inserts have not helped. Her restless leg is doing better. Her back is pain free today.   PERTINENT HISTORY:  Lumbar and cervical spine surgeries; cervical surgery ~ 5 years ago due to neck pain with pain into both arms - good resolution of  neck symptoms; lumbar fusion 2020 with "cage";  fall with fracture Lt wrist, scapula and dislocation of Lt GH joint ~ 1 year ago; ADOM; HTN; arthritis; psoriatic arthritis; osteoporosis   PAIN:  Are you having pain? Yes: NPRS scale: 0/10 Pain location: low back  Pain description: aching Aggravating factors: could be anything - worst moving sit >< stand; bending; getting in and out of car Relieving factors: time; lying down; medication   PRECAUTIONS: None  WEIGHT BEARING RESTRICTIONS: No  FALLS:  Has patient fallen in last 6 months? 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 Posterior and interbody fusion at the L5-S1 level. No findings to suggest canal stenosis. At least moderate right and mild left foraminal stenosis at this level. 2. Mild disc bulge at L4-L5 with possible mild left foraminal stenosis. 3. Aortic atherosclerosis (ICD10-I70.0).  PATIENT SURVEYS:  FOTO 42; goal 57  MUSCLE LENGTH: Hamstrings: Right 65 deg; Left 55 deg  POSTURE: rounded shoulders, forward head, decreased lumbar lordosis, increased thoracic kyphosis, and flexed trunk   PALPATION: Muscular tightness Lt posterior hip through gluts, piriformis   LUMBAR  ROM:   AROM eval  Flexion 65% pulling   Extension 20%  Right lateral flexion 65%  Left lateral flexion 50% discomfort Lt LB   Right rotation 20%  Left rotation 15%   (Blank rows = not tested)  LOWER EXTREMITY MMT:    MMT Right eval Left eval  Hip flexion 4+ 4+  Hip extension 4- 4  Hip abduction 4 4+  Hip adduction    Hip internal rotation    Hip external rotation    Knee flexion 5 5  Knee extension 4+ 4+  Ankle dorsiflexion    Ankle plantarflexion    Ankle inversion    Ankle eversion     (Blank rows = not tested)  LUMBAR SPECIAL TESTS:  Straight leg raise test: Negative and Slump test: Negative  FUNCTIONAL TESTS:  From Eval: Unable to achieve SLS without UE support  09/07/22: STSx5 - 35 sec (hands braced on thighs)  GAIT: Distance walked: 40 Assistive device utilized: None Level of assistance: Complete Independence Comments: wide based support with decreased stance phase Lt LE; decreased balance   TODAY'S TREATMENT:   [facility] Adult PT Treatment:                                                 DATE: 09/21/2022 Therapeutic Exercise: Gastroc stretch 30 sec x 2 Soleus stretch 30 sec x 2  Toe yoga 3 sec x 10 lateral toes up great toe down then reverse Wall squat 10 sec x 10 core tight  Row blue TB isometric step back core tight  STS x10 with focus on hip hinge Leg Press: DL 90# 2x10 Prone quad stretch w/strap 3x30" B Self help: Myofacial release golf ball through the plantar surface - sitting   DATE: 09/17/2022 Therapeutic Exercise: Prone prop on elbows stretch 2x30" Hooklying hip add isometric ball squeeze 12x5" S/L clamshells RTB  2x10 S/L bent knee hip abd RTB 2x10 STS x10 with focus on hip hinge Leg Press: DL 90# 2x10 Prone quad stretch w/strap 3x30" B                                                                                                                      PATIENT EDUCATION:  Education details: Updated HEP Person educated: Patient Education  method: Explanation, Demonstration, Tactile cues, Verbal cues, and Handouts Education comprehension: verbalized understanding, returned demonstration, verbal cues required, tactile cues required, and needs further education  HOME EXERCISE PROGRAM:  Access Code: [email or web address] URL: [email or web address] Date: 09/21/2022 Prepared by: Gillermo Murdoch  Exercises - Prone Press Up On Elbows  - 2 x daily - 7 x weekly - 1 sets - 3 reps - 30 sec  hold - Supine Transversus Abdominis Bracing with Pelvic Floor Contraction  - 2 x daily - 7 x weekly - 1 sets - 10 reps - 10sec  hold - Hooklying Isometric Clamshell  - 2 x daily - 7 x weekly - 1 sets - 10 reps - 3 sec  hold - Wall Quarter Squat  - 2 x daily - 7 x weekly - 1-2 sets - 10 reps - 5-10 sec  hold - Prone Hip Extension on Table  - 1 x daily - 7 x weekly - 3 sets - 10 reps - Sit to Stand with Hands on Knees  - 1 x daily - 7 x weekly - 3 sets - 10 reps - Supine March  - 1 x daily - 7 x weekly - 3 sets - 10 reps - Hooklying Hamstring Stretch with Strap  - 2 x daily - 7 x weekly - 1 sets - 3 reps - 30 sec hold - Prone Quadriceps Stretch with Strap  - 2 x daily - 7 x weekly - 1 sets - 3 reps - 30 sec hold - Clamshell with Resistance  - 1 x daily - 7 x weekly - 3 sets - 10 reps - Sidelying Bent Knee Lift at 45 Degrees  - 1 x daily - 7 x weekly - 3 sets - 10 reps - Beginner Sidelying Leg Circle  - 1 x daily - 7 x weekly - 3 sets - 10 reps - Gastroc Stretch on Wall  - 2 x daily - 7 x weekly - 1 sets - 3 reps - 30 sec  hold - Soleus Stretch on Wall  - 2 x daily - 7 x weekly - 1 sets - 3 reps - 30 sec  hold - Toe Yoga - Alternating Great Toe and Lesser Toe Extension  - 2 x daily - 7 x weekly - 1 sets - 10 reps - 3 sec  hold - Wall Quarter Squat  - 2 x daily - 7 x weekly - 1-2 sets - 10 reps - 5-10 sec  hold - Standing Shoulder Row Reactive Isometric  - 2 x daily - 7 x weekly - 1 sets - 10 reps -  30-45 sec  hold ASSESSMENT:  CLINICAL  IMPRESSION: Evaluation of Rt foot pain with exercises provided for home. Patient has some muscular and tendonous tightness in Rt great toe extension. Good response and tolerance for new exercises. Working on core strength and stabilization. Decreasing pain.    OBJECTIVE IMPAIRMENTS: decreased activity tolerance, decreased balance, decreased endurance, decreased mobility, difficulty walking, decreased ROM, decreased strength, hypomobility, increased muscle spasms, impaired flexibility, improper body mechanics, postural dysfunction, and pain.   ACTIVITY LIMITATIONS: carrying, lifting, bending, sitting, standing, squatting, stairs, transfers, bed mobility, and locomotion level  PARTICIPATION LIMITATIONS: meal prep, cleaning, laundry, shopping, community activity, and yard work  PERSONAL FACTORS: Age, Fitness, Past/current experiences, Profession, Time since onset of injury/illness/exacerbation, and comorbidities: prior lumbar and cervical fusions; psoriatic arthritis; arthritis; HTN; sedentary lifestyle are also affecting patient's functional outcome.   REHAB POTENTIAL: Good  CLINICAL DECISION MAKING: Stable/uncomplicated  EVALUATION COMPLEXITY: Low   GOALS: Goals reviewed with patient? Yes  SHORT TERM GOALS: Target date: 09/30/2022   Patient independent in initial HEP  Baseline: Goal status: INITIAL  2.  Patient demonstrates and reports improved ability to move from sit to stand and stand to sit  Baseline:  Goal status: INITIAL    LONG TERM GOALS: Target date: 10/28/2022   Increase lumbar mobility and ROM to WFL's with minimal to no pain  Baseline:  Goal status: INITIAL  2.  Increase strength bilat LE's to 4+/5 to 5/5 throughout Baseline:  Goal status: INITIAL  3.  Decrease pain Rt LB Lt hip/groin 75% allowing patient to return to more normal functional activities  Baseline:  Goal status: INITIAL  4.  Patient demonstrates and reports independent and safe transfers from  sit to stand and stand to sit as well as all bed mobility Baseline:  Goal status: INITIAL  5.  Independent in HEP including aquatic program as indicated  Baseline:  Goal status: INITIAL  6.  Improve functional limitation score to 57 Baseline: 42 Goal status: INITIAL  PLAN:  PT FREQUENCY: 2x/week  PT DURATION: 8 weeks  PLANNED INTERVENTIONS: Therapeutic exercises, Therapeutic activity, Neuromuscular re-education, Balance training, Gait training, Patient/Family education, Self Care, Joint mobilization, Aquatic Therapy, Dry Needling, Electrical stimulation, Cryotherapy, Moist heat, Taping, Ultrasound, Ionotophoresis '4mg'$ /ml Dexamethasone, Manual therapy, and Re-evaluation.  PLAN FOR NEXT SESSION: Progress exercises; core stabilization and strengthening; balance activities; manual work, DN, modalities as indicated (trial TENS if interested)    Everardo All, PT 09/21/2022, 11:50 AM

## 2022-09-28 ENCOUNTER — Ambulatory Visit: Payer: Medicare Other

## 2022-09-28 DIAGNOSIS — M5459 Other low back pain: Secondary | ICD-10-CM | POA: Diagnosis not present

## 2022-09-28 DIAGNOSIS — M6281 Muscle weakness (generalized): Secondary | ICD-10-CM

## 2022-09-28 DIAGNOSIS — R29898 Other symptoms and signs involving the musculoskeletal system: Secondary | ICD-10-CM

## 2022-09-28 DIAGNOSIS — M4316 Spondylolisthesis, lumbar region: Secondary | ICD-10-CM

## 2022-09-28 NOTE — Therapy (Signed)
OUTPATIENT PHYSICAL THERAPY THORACOLUMBAR TREATMENT   Patient Name: Margaret Moody MRN: [id number] DOB:07-19-1951, 72 y.o., female Today's Date: 09/28/2022  END OF SESSION:  PT End of Session - 09/28/22 1104     Visit Number 6    Number of Visits 16    Date for PT Re-Evaluation 10/28/22    PT Start Time 1105    PT Stop Time 1147    PT Time Calculation (min) 42 min    Activity Tolerance Patient tolerated treatment well    Behavior During Therapy WFL for tasks assessed/performed             Past Medical History:  Diagnosis Date   Anemia    Arthritis    Cancer ([place]) 2010   Breast   Diabetes mellitus without complication ([place])    Fatty liver    Fibromyalgia    GERD (gastroesophageal reflux disease)    Headache    migraines - takes Atenolol to prevent    Heart murmur    states she has not had an echo nor does she see a cardiologist   Past Surgical History:  Procedure Laterality Date   APPENDECTOMY  1972   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   EYE SURGERY Bilateral 2017   cataracts   MASTECTOMY Bilateral 2010   TONSILLECTOMY     removed when a child   [place]   Patient Active Problem List   Diagnosis Date Noted   S/P lumbar fusion 09/07/2019   Cervical spondylosis without myelopathy 04/10/2018   Depression with anxiety 03/07/2014   Malignant neoplasm of upper-outer quadrant of right female breast ([place]) 08/09/2011   Depression 07/19/2011    PCP: Dr Elenor Quinones  REFERRING PROVIDER: Dr Eustace Moore   REFERRING DIAG: Lumbar Spondylolisthesis  Rationale for Evaluation and Treatment: Rehabilitation  THERAPY DIAG:  Spondylolisthesis of lumbar region  Other low back pain  Other symptoms and signs involving the musculoskeletal system  Muscle weakness (generalized)  ONSET DATE: 01/28/22  SUBJECTIVE:                                                                                                                                                                                            SUBJECTIVE STATEMENT: Patient reports her plantar fascitis flare up from last visit, states she has very mild along top/lateral R foot. Patient reports no pain in low back today. Patient states she feels unsteady at times throughout her day.   PERTINENT HISTORY:  Lumbar and cervical spine surgeries; cervical surgery ~ 5 years ago due to neck pain with pain into  both arms - good resolution of neck symptoms; lumbar fusion 2020 with "cage";  fall with fracture Lt wrist, scapula and dislocation of Lt GH joint ~ 1 year ago; ADOM; HTN; arthritis; psoriatic arthritis; osteoporosis   PAIN:  Are you having pain? Yes: NPRS scale: 0/10 Pain location: low back  Pain description: aching Aggravating factors: could be anything - worst moving sit >< stand; bending; getting in and out of car Relieving factors: time; lying down; medication   PRECAUTIONS: None  WEIGHT BEARING RESTRICTIONS: No  FALLS:  Has patient fallen in last 6 months? 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 Posterior and interbody fusion at the L5-S1 level. No findings to suggest canal stenosis. At least moderate right and mild left foraminal stenosis at this level. 2. Mild disc bulge at L4-L5 with possible mild left foraminal stenosis. 3. Aortic atherosclerosis (ICD10-I70.0).  PATIENT SURVEYS:  FOTO 42; goal 57  MUSCLE LENGTH: Hamstrings: Right 65 deg; Left 55 deg  POSTURE: rounded shoulders, forward head, decreased lumbar lordosis, increased thoracic kyphosis, and flexed trunk   PALPATION: Muscular tightness  Lt posterior hip through gluts, piriformis   LUMBAR ROM:   AROM eval  Flexion 65% pulling   Extension 20%  Right lateral flexion 65%  Left lateral flexion 50% discomfort Lt LB   Right rotation 20%  Left rotation 15%   (Blank rows = not tested)  LOWER EXTREMITY MMT:    MMT Right eval Left eval  Hip flexion 4+ 4+  Hip extension 4- 4  Hip abduction 4 4+  Hip adduction    Hip internal rotation    Hip external rotation    Knee flexion 5 5  Knee extension 4+ 4+  Ankle dorsiflexion    Ankle plantarflexion    Ankle inversion    Ankle eversion     (Blank rows = not tested)  LUMBAR SPECIAL TESTS:  Straight leg raise test: Negative and Slump test: Negative  FUNCTIONAL TESTS:  From Eval: Unable to achieve SLS without UE support  09/07/22: STSx5 - 35 sec (hands braced on thighs)  GAIT: Distance walked: 40 Assistive device utilized: None Level of assistance: Complete Independence Comments: wide based support with decreased stance phase Lt LE; decreased balance   TODAY'S TREATMENT:   [facility] Adult PT Treatment:                                                DATE: 09/28/2022 Therapeutic Exercise: Prone press up on elbows 10x5" Supine: Modified hundred SKTC 2x10" B --> DKTC 5x5" Pilates single leg & double leg stretch (head down) STS x10 hands braced on thighs Paloff press GTB x10 B Shoulder extension GTB x5 Standing marching (no HHA) --> added opposite knee taps  Fwd marching 2L  Resisted side stepping RTB (counter) Resisted fwd zig-zag stepping RTB (counter) Standing resisted unilateral marching YTB (light resistance) Standing hip ext x12 B Prone quad stretch w/strap 2x30" B   [facility] Adult PT Treatment:  DATE: 09/21/2022 Therapeutic Exercise: Gastroc stretch 30 sec x 2 Soleus stretch 30 sec x 2  Toe yoga 3 sec x 10 lateral toes up great toe down then reverse Wall squat 10 sec x 10 core tight  Row blue TB isometric step  back core tight  STS x10 with focus on hip hinge Leg Press: DL 90# 2x10 Prone quad stretch w/strap 3x30" B Self help: Myofacial release golf ball through the plantar surface - sitting                                                                                                                        PATIENT EDUCATION:  Education details: HEP Review Person educated: Patient Education method: Explanation, Demonstration, Tactile cues, Verbal cues, and Handouts Education comprehension: verbalized understanding, returned demonstration, verbal cues required, tactile cues required, and needs further education  HOME EXERCISE PROGRAM:  Access Code: [email or web address] URL: [email or web address] Date: 09/21/2022 Prepared by: Gillermo Murdoch  Exercises - Prone Press Up On Elbows  - 2 x daily - 7 x weekly - 1 sets - 3 reps - 30 sec  hold - Supine Transversus Abdominis Bracing with Pelvic Floor Contraction  - 2 x daily - 7 x weekly - 1 sets - 10 reps - 10sec  hold - Hooklying Isometric Clamshell  - 2 x daily - 7 x weekly - 1 sets - 10 reps - 3 sec  hold - Wall Quarter Squat  - 2 x daily - 7 x weekly - 1-2 sets - 10 reps - 5-10 sec  hold - Prone Hip Extension on Table  - 1 x daily - 7 x weekly - 3 sets - 10 reps - Sit to Stand with Hands on Knees  - 1 x daily - 7 x weekly - 3 sets - 10 reps - Supine March  - 1 x daily - 7 x weekly - 3 sets - 10 reps - Hooklying Hamstring Stretch with Strap  - 2 x daily - 7 x weekly - 1 sets - 3 reps - 30 sec hold - Prone Quadriceps Stretch with Strap  - 2 x daily - 7 x weekly - 1 sets - 3 reps - 30 sec hold - Clamshell with Resistance  - 1 x daily - 7 x weekly - 3 sets - 10 reps - Sidelying Bent Knee Lift at 45 Degrees  - 1 x daily - 7 x weekly - 3 sets - 10 reps - Beginner Sidelying Leg Circle  - 1 x daily - 7 x weekly - 3 sets - 10 reps - Gastroc Stretch on Wall  - 2 x daily - 7 x weekly - 1 sets - 3 reps - 30 sec  hold - Soleus Stretch on Wall  - 2 x daily -  7 x weekly - 1 sets - 3 reps - 30 sec  hold - Toe Yoga - Alternating Great Toe and Lesser Toe Extension  - 2 x daily - 7 x weekly -  1 sets - 10 reps - 3 sec  hold - Wall Quarter Squat  - 2 x daily - 7 x weekly - 1-2 sets - 10 reps - 5-10 sec  hold - Standing Shoulder Row Reactive Isometric  - 2 x daily - 7 x weekly - 1 sets - 10 reps - 30-45 sec  hold   ASSESSMENT:  CLINICAL IMPRESSION: Continued exercise focused on core strength and challenging stabilization; patient demonstrated good implementation of verbal cues for postural alignment during paloff press exercise. Dynamic balance and postural stability challenged with marching and resisted ambulation; mild postural sway exhibited during marching with added reaching across midline.    OBJECTIVE IMPAIRMENTS: decreased activity tolerance, decreased balance, decreased endurance, decreased mobility, difficulty walking, decreased ROM, decreased strength, hypomobility, increased muscle spasms, impaired flexibility, improper body mechanics, postural dysfunction, and pain.   ACTIVITY LIMITATIONS: carrying, lifting, bending, sitting, standing, squatting, stairs, transfers, bed mobility, and locomotion level  PARTICIPATION LIMITATIONS: meal prep, cleaning, laundry, shopping, community activity, and yard work  PERSONAL FACTORS: Age, Fitness, Past/current experiences, Profession, Time since onset of injury/illness/exacerbation, and comorbidities: prior lumbar and cervical fusions; psoriatic arthritis; arthritis; HTN; sedentary lifestyle are also affecting patient's functional outcome.   REHAB POTENTIAL: Good  CLINICAL DECISION MAKING: Stable/uncomplicated  EVALUATION COMPLEXITY: Low   GOALS: Goals reviewed with patient? Yes  SHORT TERM GOALS: Target date: 09/30/2022   Patient independent in initial HEP  Baseline: Goal status: MET on 09/28/22  2.  Patient demonstrates and reports improved ability to move from sit to stand and stand to sit   Baseline:  Goal status: MET on 09/28/22    LONG TERM GOALS: Target date: 10/28/2022   Increase lumbar mobility and ROM to WFL's with minimal to no pain  Baseline:  Goal status: INITIAL  2.  Increase strength bilat LE's to 4+/5 to 5/5 throughout Baseline:  Goal status: INITIAL  3.  Decrease pain Rt LB Lt hip/groin 75% allowing patient to return to more normal functional activities  Baseline:  Goal status: INITIAL  4.  Patient demonstrates and reports independent and safe transfers from sit to stand and stand to sit as well as all bed mobility Baseline:  Goal status: INITIAL  5.  Independent in HEP including aquatic program as indicated  Baseline:  Goal status: INITIAL  6.  Improve functional limitation score to 57 Baseline: 42 Goal status: INITIAL  PLAN:  PT FREQUENCY: 2x/week  PT DURATION: 8 weeks  PLANNED INTERVENTIONS: Therapeutic exercises, Therapeutic activity, Neuromuscular re-education, Balance training, Gait training, Patient/Family education, Self Care, Joint mobilization, Aquatic Therapy, Dry Needling, Electrical stimulation, Cryotherapy, Moist heat, Taping, Ultrasound, Ionotophoresis '4mg'$ /ml Dexamethasone, Manual therapy, and Re-evaluation.  PLAN FOR NEXT SESSION: Progress exercises; Core stabilization and strengthening; Dynamic balance activities.   Helane Gunther, PTA 09/28/2022

## 2022-10-01 ENCOUNTER — Ambulatory Visit: Payer: Medicare Other | Attending: Neurological Surgery

## 2022-10-01 DIAGNOSIS — M6281 Muscle weakness (generalized): Secondary | ICD-10-CM | POA: Diagnosis present

## 2022-10-01 DIAGNOSIS — R29898 Other symptoms and signs involving the musculoskeletal system: Secondary | ICD-10-CM | POA: Insufficient documentation

## 2022-10-01 DIAGNOSIS — M5459 Other low back pain: Secondary | ICD-10-CM | POA: Diagnosis present

## 2022-10-01 DIAGNOSIS — M4316 Spondylolisthesis, lumbar region: Secondary | ICD-10-CM | POA: Insufficient documentation

## 2022-10-01 NOTE — Therapy (Signed)
OUTPATIENT PHYSICAL THERAPY THORACOLUMBAR TREATMENT   Patient Name: Margaret Moody MRN: [id number] DOB:07-26-1951, 72 y.o., female Today's Date: 10/01/2022  END OF SESSION:  PT End of Session - 10/01/22 1104     Visit Number 7    Number of Visits 16    Date for PT Re-Evaluation 10/28/22    PT Start Time 1105    PT Stop Time 2376    PT Time Calculation (min) 47 min             Past Medical History:  Diagnosis Date   Anemia    Arthritis    Cancer ([place]) 2010   Breast   Diabetes mellitus without complication ([place])    Fatty liver    Fibromyalgia    GERD (gastroesophageal reflux disease)    Headache    migraines - takes Atenolol to prevent    Heart murmur    states she has not had an echo nor does she see a cardiologist   Past Surgical History:  Procedure Laterality Date   APPENDECTOMY  1972   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   EYE SURGERY Bilateral 2017   cataracts   MASTECTOMY Bilateral 2010   TONSILLECTOMY     removed when a child   [place]   Patient Active Problem List   Diagnosis Date Noted   S/P lumbar fusion 09/07/2019   Cervical spondylosis without myelopathy 04/10/2018   Depression with anxiety 03/07/2014   Malignant neoplasm of upper-outer quadrant of right female breast ([place]) 08/09/2011   Depression 07/19/2011    PCP: Dr Elenor Quinones  REFERRING PROVIDER: Dr Eustace Moore   REFERRING DIAG: Lumbar Spondylolisthesis  Rationale for Evaluation and Treatment: Rehabilitation  THERAPY DIAG:  Spondylolisthesis of lumbar region  Other low back pain  Other symptoms and signs involving the musculoskeletal system  Muscle weakness (generalized)  ONSET DATE: 01/28/22  SUBJECTIVE:                                                                                                                                                                                           SUBJECTIVE STATEMENT: Patient reports no pain in low back or  foot today, states sometimes she has discomfort on bottom of foot when stretching. Patient states her balance is feeling slightly more steady since last visit.   PERTINENT HISTORY:  Lumbar and cervical spine surgeries; cervical surgery ~ 5 years ago due to neck pain with pain into both arms - good resolution of neck symptoms; lumbar fusion 2020 with "cage";  fall with fracture Lt wrist, scapula and dislocation of  Lt GH joint ~ 1 year ago; ADOM; HTN; arthritis; psoriatic arthritis; osteoporosis   PAIN:  Are you having pain? Yes: NPRS scale: 0/10 Pain location: low back  Pain description: aching Aggravating factors: could be anything - worst moving sit >< stand; bending; getting in and out of car Relieving factors: time; lying down; medication   PRECAUTIONS: None  WEIGHT BEARING RESTRICTIONS: No  FALLS:  Has patient fallen in last 6 months? 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 Posterior and interbody fusion at the L5-S1 level. No findings to suggest canal stenosis. At least moderate right and mild left foraminal stenosis at this level. 2. Mild disc bulge at L4-L5 with possible mild left foraminal stenosis. 3. Aortic atherosclerosis (ICD10-I70.0).  PATIENT SURVEYS:  FOTO 42; goal 57  MUSCLE LENGTH: Hamstrings: Right 65 deg; Left 55 deg  POSTURE: rounded shoulders, forward head, decreased lumbar lordosis, increased thoracic kyphosis, and flexed trunk   PALPATION: Muscular tightness Lt posterior hip through gluts, piriformis   LUMBAR ROM:   AROM eval  Flexion 65% pulling   Extension 20%  Right lateral  flexion 65%  Left lateral flexion 50% discomfort Lt LB   Right rotation 20%  Left rotation 15%   (Blank rows = not tested)  LOWER EXTREMITY MMT:    MMT Right eval Left eval  Hip flexion 4+ 4+  Hip extension 4- 4  Hip abduction 4 4+  Hip adduction    Hip internal rotation    Hip external rotation    Knee flexion 5 5  Knee extension 4+ 4+  Ankle dorsiflexion    Ankle plantarflexion    Ankle inversion    Ankle eversion     (Blank rows = not tested)  LUMBAR SPECIAL TESTS:  Straight leg raise test: Negative and Slump test: Negative  FUNCTIONAL TESTS:  From Eval: Unable to achieve SLS without UE support  09/07/22: STSx5 - 35 sec (hands braced on thighs)  GAIT: Distance walked: 40 Assistive device utilized: None Level of assistance: Complete Independence Comments: wide based support with decreased stance phase Lt LE; decreased balance   TODAY'S TREATMENT:   [facility] Adult PT Treatment:                                                DATE: 10/01/2022 Therapeutic Exercise: NuStep L6 x 63mn SKTC 2x10" B --> DKTC 3x5" Pilates single leg & double leg stretch (head down) Modified dead bug x10 Orange SB Seated core bracing w/Orange SB press down Tandem stance balance 3x30" B Paloff press GTB x10 B Squat dead lift 5#KB x10 Fwd amb + 10#KB suitcase carry 1L each hand  Fwd amb 10# farmer carries  2L Tandem amb at counter 4L   [facility]  [facility]: [facility] Therapeutic Exercise: Prone press up on elbows 10x5" Supine: Modified hundred SKTC 2x10" B --> DKTC 5x5" Pilates single leg & double leg stretch (head down) STS x10 hands braced on thighs Paloff press GTB x10 B Shoulder extension GTB x5 Standing marching (no HHA) --> added opposite knee taps  Fwd marching 2L  Resisted side stepping RTB (counter) Resisted fwd zig-zag stepping RTB (counter) Standing resisted unilateral marching YTB (light resistance) Standing hip ext  x12 B Prone quad stretch w/strap 2x30" B                                                                                                                       PATIENT EDUCATION:  Education details: HEP Review Person educated: Patient Education method: Explanation, Demonstration, Tactile cues, Verbal cues, and Handouts Education comprehension: verbalized understanding, returned demonstration, verbal cues required, tactile cues required, and needs further education  HOME EXERCISE PROGRAM:  Access Code: [email or web address] URL: [email or web address] Date: 09/21/2022 Prepared by: Gillermo Murdoch  Exercises - Prone Press Up On Elbows  - 2 x daily - 7 x weekly - 1 sets - 3 reps - 30 sec  hold - Supine Transversus Abdominis Bracing with Pelvic Floor Contraction  - 2 x daily - 7 x weekly - 1 sets - 10 reps - 10sec  hold - Hooklying Isometric Clamshell  - 2 x daily - 7 x weekly - 1 sets - 10 reps - 3 sec  hold - Wall Quarter Squat  - 2 x daily - 7 x weekly - 1-2 sets - 10 reps - 5-10 sec  hold - Prone Hip Extension on Table  - 1 x daily - 7 x weekly - 3 sets - 10 reps - Sit to Stand with Hands on Knees  - 1 x daily - 7 x weekly - 3 sets - 10 reps - Supine March  - 1 x daily - 7 x weekly - 3 sets - 10 reps - Hooklying Hamstring Stretch with Strap  - 2 x daily - 7 x weekly - 1 sets - 3 reps - 30 sec hold - Prone Quadriceps Stretch with Strap  - 2 x daily - 7 x weekly - 1 sets - 3 reps - 30 sec hold - Clamshell with Resistance  - 1 x daily - 7 x weekly - 3 sets - 10 reps - Sidelying Bent Knee Lift at 45 Degrees  - 1 x daily - 7 x weekly - 3 sets - 10 reps - Beginner Sidelying Leg Circle  - 1 x daily - 7 x weekly - 3 sets - 10 reps - Gastroc Stretch on Wall  - 2 x daily - 7 x weekly - 1 sets - 3 reps - 30 sec  hold - Soleus Stretch on Wall  - 2 x daily - 7 x weekly - 1 sets - 3 reps - 30 sec  hold - Toe Yoga - Alternating Great Toe and Lesser Toe Extension  - 2 x  daily - 7 x weekly - 1 sets - 10  reps - 3 sec  hold - Wall Quarter Squat  - 2 x daily - 7 x weekly - 1-2 sets - 10 reps - 5-10 sec  hold - Standing Shoulder Row Reactive Isometric  - 2 x daily - 7 x weekly - 1 sets - 10 reps - 30-45 sec  hold   ASSESSMENT:  CLINICAL IMPRESSION: Focus on abdominal bracing continued with progression of postural strengthening. Fair ankle strategy improved over time during tandem stance balance. Lifting mechanics progressed with lightly resisted squat deadlifts and lifting/lowering weighted box; patient demonstrated good body mechanics with no exacerbation of pain.   OBJECTIVE IMPAIRMENTS: decreased activity tolerance, decreased balance, decreased endurance, decreased mobility, difficulty walking, decreased ROM, decreased strength, hypomobility, increased muscle spasms, impaired flexibility, improper body mechanics, postural dysfunction, and pain.   ACTIVITY LIMITATIONS: carrying, lifting, bending, sitting, standing, squatting, stairs, transfers, bed mobility, and locomotion level  PARTICIPATION LIMITATIONS: meal prep, cleaning, laundry, shopping, community activity, and yard work  PERSONAL FACTORS: Age, Fitness, Past/current experiences, Profession, Time since onset of injury/illness/exacerbation, and comorbidities: prior lumbar and cervical fusions; psoriatic arthritis; arthritis; HTN; sedentary lifestyle are also affecting patient's functional outcome.   REHAB POTENTIAL: Good  CLINICAL DECISION MAKING: Stable/uncomplicated  EVALUATION COMPLEXITY: Low   GOALS: Goals reviewed with patient? Yes  SHORT TERM GOALS: Target date: 09/30/2022   Patient independent in initial HEP  Baseline: Goal status: MET on 09/28/22  2.  Patient demonstrates and reports improved ability to move from sit to stand and stand to sit  Baseline:  Goal status: MET on 09/28/22    LONG TERM GOALS: Target date: 10/28/2022   Increase lumbar mobility and ROM to WFL's with minimal to no pain  Baseline:  Goal  status: INITIAL  2.  Increase strength bilat LE's to 4+/5 to 5/5 throughout Baseline:  Goal status: INITIAL  3.  Decrease pain Rt LB Lt hip/groin 75% allowing patient to return to more normal functional activities  Baseline:  Goal status: INITIAL  4.  Patient demonstrates and reports independent and safe transfers from sit to stand and stand to sit as well as all bed mobility Baseline:  Goal status: INITIAL  5.  Independent in HEP including aquatic program as indicated  Baseline:  Goal status: INITIAL  6.  Improve functional limitation score to 57 Baseline: 42 Goal status: INITIAL  PLAN:  PT FREQUENCY: 2x/week  PT DURATION: 8 weeks  PLANNED INTERVENTIONS: Therapeutic exercises, Therapeutic activity, Neuromuscular re-education, Balance training, Gait training, Patient/Family education, Self Care, Joint mobilization, Aquatic Therapy, Dry Needling, Electrical stimulation, Cryotherapy, Moist heat, Taping, Ultrasound, Ionotophoresis '4mg'$ /ml Dexamethasone, Manual therapy, and Re-evaluation.  PLAN FOR NEXT SESSION: Progress exercises; Core stabilization and strengthening; Dynamic balance activities.   Helane Gunther, PTA 10/01/2022

## 2022-10-05 ENCOUNTER — Ambulatory Visit: Payer: Medicare Other

## 2022-10-05 DIAGNOSIS — M4316 Spondylolisthesis, lumbar region: Secondary | ICD-10-CM | POA: Diagnosis not present

## 2022-10-05 DIAGNOSIS — M5459 Other low back pain: Secondary | ICD-10-CM

## 2022-10-05 DIAGNOSIS — R29898 Other symptoms and signs involving the musculoskeletal system: Secondary | ICD-10-CM

## 2022-10-05 DIAGNOSIS — M6281 Muscle weakness (generalized): Secondary | ICD-10-CM

## 2022-10-05 NOTE — Therapy (Signed)
OUTPATIENT PHYSICAL THERAPY THORACOLUMBAR TREATMENT   Patient Name: Margaret Moody MRN: [id number] DOB:02-20-1951, 72 y.o., female Today's Date: 10/05/2022  END OF SESSION:  PT End of Session - 10/05/22 0865     Visit Number 8    Number of Visits 16    Date for PT Re-Evaluation 10/28/22    PT Start Time 0922    PT Stop Time 1009    PT Time Calculation (min) 47 min    Activity Tolerance Patient tolerated treatment well    Behavior During Therapy [facility] for tasks assessed/performed             Past Medical History:  Diagnosis Date   Anemia    Arthritis    Cancer ([place]) 2010   Breast   Diabetes mellitus without complication ([place])    Fatty liver    Fibromyalgia    GERD (gastroesophageal reflux disease)    Headache    migraines - takes Atenolol to prevent    Heart murmur    states she has not had an echo nor does she see a cardiologist   Past Surgical History:  Procedure Laterality Date   APPENDECTOMY  1972   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   EYE SURGERY Bilateral 2017   cataracts   MASTECTOMY Bilateral 2010   TONSILLECTOMY     removed when a child   [place]   Patient Active Problem List   Diagnosis Date Noted   S/P lumbar fusion 09/07/2019   Cervical spondylosis without myelopathy 04/10/2018   Depression with anxiety 03/07/2014   Malignant neoplasm of upper-outer quadrant of right female breast ([place]) 08/09/2011   Depression 07/19/2011    PCP: Dr Elenor Quinones  REFERRING PROVIDER: Dr Eustace Moore   REFERRING DIAG: Lumbar Spondylolisthesis  Rationale for Evaluation and Treatment: Rehabilitation  THERAPY DIAG:  Spondylolisthesis of lumbar region  Other low back pain  Other symptoms and signs involving the musculoskeletal system  Muscle weakness (generalized)  ONSET DATE: 01/28/22  SUBJECTIVE:                                                                                                                                                                                            SUBJECTIVE STATEMENT: Patient report she was sore in low back yesterday but is feeling fine today.    PERTINENT HISTORY:  Lumbar and cervical spine surgeries; cervical surgery ~ 5 years ago due to neck pain with pain into both arms - good resolution of neck symptoms; lumbar fusion 2020 with "cage";  fall with fracture Lt wrist, scapula and dislocation  of Lt [place] joint ~ 1 year ago; ADOM; HTN; arthritis; psoriatic arthritis; osteoporosis   PAIN:  Are you having pain? Yes: NPRS scale: 0/10 Pain location: low back  Pain description: aching Aggravating factors: could be anything - worst moving sit >< stand; bending; getting in and out of car Relieving factors: time; lying down; medication   PRECAUTIONS: None  WEIGHT BEARING RESTRICTIONS: No  FALLS:  Has patient fallen in last 6 months? 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 Posterior and interbody fusion at the L5-S1 level. No findings to suggest canal stenosis. At least moderate right and mild left foraminal stenosis at this level. 2. Mild disc bulge at L4-L5 with possible mild left foraminal stenosis. 3. Aortic atherosclerosis (ICD10-I70.0).  PATIENT SURVEYS:  FOTO 42; goal 57 10/05/22:  MUSCLE LENGTH: Hamstrings: Right 65 deg; Left 55 deg  POSTURE: rounded shoulders, forward head, decreased lumbar lordosis, increased thoracic kyphosis, and flexed trunk   PALPATION: Muscular tightness Lt posterior hip through gluts, piriformis   LUMBAR ROM:   AROM eval  Flexion 65% pulling   Extension 20%  Right lateral  flexion 65%  Left lateral flexion 50% discomfort Lt LB   Right rotation 20%  Left rotation 15%   (Blank rows = not tested)  LOWER EXTREMITY MMT:    MMT Right eval Left eval  Hip flexion 4+ 4+  Hip extension 4- 4  Hip abduction 4 4+  Hip adduction    Hip internal rotation    Hip external rotation    Knee flexion 5 5  Knee extension 4+ 4+  Ankle dorsiflexion    Ankle plantarflexion    Ankle inversion    Ankle eversion     (Blank rows = not tested)  LUMBAR SPECIAL TESTS:  Straight leg raise test: Negative and Slump test: Negative  FUNCTIONAL TESTS:  From Eval: Unable to achieve SLS without UE support  09/07/22: STSx5 - 35 sec (hands braced on thighs)  GAIT: Distance walked: 40 Assistive device utilized: None Level of assistance: Complete Independence Comments: wide based support with decreased stance phase Lt LE; decreased balance   TODAY'S TREATMENT:   [facility] Adult PT Treatment:                                                DATE: 10/05/2022 Therapeutic Exercise: NuStep L6 x 52mn Ribcage breathing x 5 rounds --> diaphragmatic breathing x 5 rounds Unilateral hip flexion isometric holds 4x3" B Single leg stretch --> double leg stretch DKTC --> SKTC Dead bug w/orange SB --> dead bug Seated abdominal bracing with orange SB Seated paloff press GTB + perturbations x10 B Tandem balance x30" B --> mTandem added slow R/L HT x30" B mTandem amb (counter) 2L Fwd amb 10# farmer carries 4L Bkwd amb 3L Hip hinge with dowel Squat deadlift 5#KB on stool (focus on hip hinge mechanics)    [facility] Adult PT Treatment:  DATE: 10/01/2022 Therapeutic Exercise: NuStep L6 x 64mn SKTC 2x10" B --> DKTC 3x5" Pilates single leg & double leg stretch (head down) Modified dead bug x10 Orange SB Seated core bracing w/Orange SB press down Tandem stance balance 3x30" B Paloff press GTB x10 B Squat dead lift 5#KB x10 Fwd amb + 10#KB suitcase carry 1L  each hand  Fwd amb 10# farmer carries  2L Tandem amb at counter 4L                                                                                                                       PATIENT EDUCATION:  Education details: Hip hinge body mechanics with lifting Person educated: Patient Education method: Explanation, Demonstration, Tactile cues, Verbal cues, and Handouts Education comprehension: verbalized understanding, returned demonstration, verbal cues required, tactile cues required, and needs further education  HOME EXERCISE PROGRAM:  Access Code: [email or web address]: [email or web address] Date: 09/21/2022 Prepared by: CGillermo Murdoch Exercises - Prone Press Up On Elbows  - 2 x daily - 7 x weekly - 1 sets - 3 reps - 30 sec  hold - Supine Transversus Abdominis Bracing with Pelvic Floor Contraction  - 2 x daily - 7 x weekly - 1 sets - 10 reps - 10sec  hold - Hooklying Isometric Clamshell  - 2 x daily - 7 x weekly - 1 sets - 10 reps - 3 sec  hold - Wall Quarter Squat  - 2 x daily - 7 x weekly - 1-2 sets - 10 reps - 5-10 sec  hold - Prone Hip Extension on Table  - 1 x daily - 7 x weekly - 3 sets - 10 reps - Sit to Stand with Hands on Knees  - 1 x daily - 7 x weekly - 3 sets - 10 reps - Supine March  - 1 x daily - 7 x weekly - 3 sets - 10 reps - Hooklying Hamstring Stretch with Strap  - 2 x daily - 7 x weekly - 1 sets - 3 reps - 30 sec hold - Prone Quadriceps Stretch with Strap  - 2 x daily - 7 x weekly - 1 sets - 3 reps - 30 sec hold - Clamshell with Resistance  - 1 x daily - 7 x weekly - 3 sets - 10 reps - Sidelying Bent Knee Lift at 45 Degrees  - 1 x daily - 7 x weekly - 3 sets - 10 reps - Beginner Sidelying Leg Circle  - 1 x daily - 7 x weekly - 3 sets - 10 reps - Gastroc Stretch on Wall  - 2 x daily - 7 x weekly - 1 sets - 3 reps - 30 sec  hold - Soleus Stretch on Wall  - 2 x daily - 7 x weekly - 1 sets - 3 reps - 30 sec  hold - Toe Yoga - Alternating Great Toe and Lesser Toe  Extension  - 2 x daily - 7 x weekly - 1 sets -  10 reps - 3 sec  hold - Wall Quarter Squat  - 2 x daily - 7 x weekly - 1-2 sets - 10 reps - 5-10 sec  hold - Standing Shoulder Row Reactive Isometric  - 2 x daily - 7 x weekly - 1 sets - 10 reps - 30-45 sec  hold   ASSESSMENT:  CLINICAL IMPRESSION: Patient demonstrated improved TA activation and abdominal bracing techniques during seated postural exercises. Tactile cues provided to improve scapular retraction on right side and improve postural stability during seated paloff press; perturbations added to challenge core stabilization. Hip hinge mechanics progressed with use of dowel to improve postural awareness and alignment.  OBJECTIVE IMPAIRMENTS: decreased activity tolerance, decreased balance, decreased endurance, decreased mobility, difficulty walking, decreased ROM, decreased strength, hypomobility, increased muscle spasms, impaired flexibility, improper body mechanics, postural dysfunction, and pain.   ACTIVITY LIMITATIONS: carrying, lifting, bending, sitting, standing, squatting, stairs, transfers, bed mobility, and locomotion level  PARTICIPATION LIMITATIONS: meal prep, cleaning, laundry, shopping, community activity, and yard work  PERSONAL FACTORS: Age, Fitness, Past/current experiences, Profession, Time since onset of injury/illness/exacerbation, and comorbidities: prior lumbar and cervical fusions; psoriatic arthritis; arthritis; HTN; sedentary lifestyle are also affecting patient's functional outcome.   REHAB POTENTIAL: Good  CLINICAL DECISION MAKING: Stable/uncomplicated  EVALUATION COMPLEXITY: Low   GOALS: Goals reviewed with patient? Yes  SHORT TERM GOALS: Target date: 09/30/2022  Patient independent in initial HEP  Baseline: Goal status: MET on 09/28/22  2.  Patient demonstrates and reports improved ability to move from sit to stand and stand to sit  Baseline:  Goal status: MET on 09/28/22    LONG TERM GOALS: Target  date: 10/28/2022  Increase lumbar mobility and ROM to WFL's with minimal to no pain  Baseline:  Goal status: INITIAL  2.  Increase strength bilat LE's to 4+/5 to 5/5 throughout Baseline:  Goal status: INITIAL  3.  Decrease pain Rt LB Lt hip/groin 75% allowing patient to return to more normal functional activities  Baseline:  Goal status: INITIAL  4.  Patient demonstrates and reports independent and safe transfers from sit to stand and stand to sit as well as all bed mobility Baseline:  Goal status: INITIAL  5.  Independent in HEP including aquatic program as indicated  Baseline:  Goal status: INITIAL  6.  Improve functional limitation score to 57 Baseline: 42 Goal status: INITIAL  PLAN:  PT FREQUENCY: 2x/week  PT DURATION: 8 weeks  PLANNED INTERVENTIONS: Therapeutic exercises, Therapeutic activity, Neuromuscular re-education, Balance training, Gait training, Patient/Family education, Self Care, Joint mobilization, Aquatic Therapy, Dry Needling, Electrical stimulation, Cryotherapy, Moist heat, Taping, Ultrasound, Ionotophoresis '4mg'$ /ml Dexamethasone, Manual therapy, and Re-evaluation.  PLAN FOR NEXT SESSION: Progress exercises; Core stabilization and strengthening; Dynamic balance activities.   Helane Gunther, PTA 10/05/2022

## 2022-10-08 ENCOUNTER — Ambulatory Visit: Payer: Medicare Other

## 2022-10-08 DIAGNOSIS — R29898 Other symptoms and signs involving the musculoskeletal system: Secondary | ICD-10-CM

## 2022-10-08 DIAGNOSIS — M4316 Spondylolisthesis, lumbar region: Secondary | ICD-10-CM | POA: Diagnosis not present

## 2022-10-08 DIAGNOSIS — M6281 Muscle weakness (generalized): Secondary | ICD-10-CM

## 2022-10-08 DIAGNOSIS — M5459 Other low back pain: Secondary | ICD-10-CM

## 2022-10-08 NOTE — Therapy (Signed)
OUTPATIENT PHYSICAL THERAPY THORACOLUMBAR TREATMENT   Patient Name: Margaret Moody MRN: [id number] DOB:1951-02-14, 72 y.o., female Today's Date: 10/08/2022  END OF SESSION:  PT End of Session - 10/08/22 0931     Visit Number 9    Number of Visits 16    Date for PT Re-Evaluation 10/28/22    PT Start Time 0932    PT Stop Time 1013    PT Time Calculation (min) 41 min    Activity Tolerance Patient tolerated treatment well    Behavior During Therapy [facility] for tasks assessed/performed             Past Medical History:  Diagnosis Date   Anemia    Arthritis    Cancer ([place]) 2010   Breast   Diabetes mellitus without complication ([place])    Fatty liver    Fibromyalgia    GERD (gastroesophageal reflux disease)    Headache    migraines - takes Atenolol to prevent    Heart murmur    states she has not had an echo nor does she see a cardiologist   Past Surgical History:  Procedure Laterality Date   APPENDECTOMY  1972   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   EYE SURGERY Bilateral 2017   cataracts   MASTECTOMY Bilateral 2010   TONSILLECTOMY     removed when a child   [place]   Patient Active Problem List   Diagnosis Date Noted   S/P lumbar fusion 09/07/2019   Cervical spondylosis without myelopathy 04/10/2018   Depression with anxiety 03/07/2014   Malignant neoplasm of upper-outer quadrant of right female breast ([place]) 08/09/2011   Depression 07/19/2011    PCP: Dr Elenor Quinones  REFERRING PROVIDER: Dr Eustace Moore   REFERRING DIAG: Lumbar Spondylolisthesis  Rationale for Evaluation and Treatment: Rehabilitation  THERAPY DIAG:  Spondylolisthesis of lumbar region  Other low back pain  Other symptoms and signs involving the musculoskeletal system  Muscle weakness (generalized)  ONSET DATE: 01/28/22  SUBJECTIVE:                                                                                                                                                                                            SUBJECTIVE STATEMENT: Patient reports her back feels fine today, no soreness. Patient states she continues to feel unsteady when walking, states she finds herself weaving.     PERTINENT HISTORY:  Lumbar and cervical spine surgeries; cervical surgery ~ 5 years ago due to neck pain with pain into both arms - good resolution of neck symptoms; lumbar fusion 2020 with "  cage";  fall with fracture Lt wrist, scapula and dislocation of Lt [place] joint ~ 1 year ago; ADOM; HTN; arthritis; psoriatic arthritis; osteoporosis   PAIN:  Are you having pain? Yes: NPRS scale: 0/10 Pain location: low back  Pain description: aching Aggravating factors: could be anything - worst moving sit >< stand; bending; getting in and out of car Relieving factors: time; lying down; medication   PRECAUTIONS: None  WEIGHT BEARING RESTRICTIONS: No  FALLS:  Has patient fallen in last 6 months? 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 Posterior and interbody fusion at the L5-S1 level. No findings to suggest canal stenosis. At least moderate right and mild left foraminal stenosis at this level. 2. Mild disc bulge at L4-L5 with possible mild left foraminal stenosis. 3. Aortic atherosclerosis (ICD10-I70.0).  PATIENT SURVEYS:  FOTO 42; goal 57 10/08/22: 63  MUSCLE LENGTH: Hamstrings: Right 65 deg; Left 55 deg  POSTURE: rounded shoulders, forward head, decreased lumbar lordosis, increased thoracic kyphosis, and flexed trunk   PALPATION: Muscular tightness Lt posterior hip through gluts, piriformis   LUMBAR  ROM:   AROM eval  Flexion 65% pulling   Extension 20%  Right lateral flexion 65%  Left lateral flexion 50% discomfort Lt LB   Right rotation 20%  Left rotation 15%   (Blank rows = not tested)  LOWER EXTREMITY MMT:    MMT Right eval Left eval  Hip flexion 4+ 4+  Hip extension 4- 4  Hip abduction 4 4+  Hip adduction    Hip internal rotation    Hip external rotation    Knee flexion 5 5  Knee extension 4+ 4+  Ankle dorsiflexion    Ankle plantarflexion    Ankle inversion    Ankle eversion     (Blank rows = not tested)  LUMBAR SPECIAL TESTS:  Straight leg raise test: Negative and Slump test: Negative  FUNCTIONAL TESTS:  From Eval: Unable to achieve SLS without UE support  09/07/22: STSx5 - 35 sec (hands braced on thighs)  GAIT: Distance walked: 40 Assistive device utilized: None Level of assistance: Complete Independence Comments: wide based support with decreased stance phase Lt LE; decreased balance   TODAY'S TREATMENT:   [facility] Adult PT Treatment:                                                DATE: 10/08/2022 Therapeutic Exercise: NuStep L6 x 98mn SKTC --> DKTC Dead bug x10 Seated deadlift 8#DB x10 Standing paloff press BTB x12 B Hip hinge with dowel --> squat deadlift 8#DB on stool (focus on hip hinge mechanics) Sled push/pull 0# 2L --> 20# 2L --> 30# 2L   [facility] Adult PT Treatment:                                                DATE: 10/05/2022 Therapeutic Exercise:  NuStep L6 x 13mn Ribcage breathing x 5 rounds --> diaphragmatic breathing x 5 rounds Unilateral hip flexion isometric holds 4x3" B Single leg stretch --> double leg stretch DKTC --> SKTC Dead bug w/orange SB --> dead bug Seated abdominal bracing with orange SB Seated paloff press GTB + perturbations x10 B Tandem balance x30" B --> mTandem added slow R/L HT x30" B mTandem amb (counter) 2L Fwd amb 10# farmer carries 4L Bkwd amb 3L Hip hinge with dowel Squat deadlift 5#KB on stool (focus on hip  hinge mechanics)                                                                                                                       PATIENT EDUCATION:  Education details: VAeronautical engineer Person educated: Patient Education method: Explanation, Demonstration, Tactile cues, Verbal cues, and Handouts Education comprehension: verbalized understanding, returned demonstration, verbal cues required, tactile cues required, and needs further education  HOME EXERCISE PROGRAM: Access Code: [email or web address]: [email or web address] Date: 09/21/2022 Prepared by: CGillermo Murdoch Exercises - Prone Press Up On Elbows  - 2 x daily - 7 x weekly - 1 sets - 3 reps - 30 sec  hold - Supine Transversus Abdominis Bracing with Pelvic Floor Contraction  - 2 x daily - 7 x weekly - 1 sets - 10 reps - 10sec  hold - Hooklying Isometric Clamshell  - 2 x daily - 7 x weekly - 1 sets - 10 reps - 3 sec  hold - Wall Quarter Squat  - 2 x daily - 7 x weekly - 1-2 sets - 10 reps - 5-10 sec  hold - Prone Hip Extension on Table  - 1 x daily - 7 x weekly - 3 sets - 10 reps - Sit to Stand with Hands on Knees  - 1 x daily - 7 x weekly - 3 sets - 10 reps - Supine March  - 1 x daily - 7 x weekly - 3 sets - 10 reps - Hooklying Hamstring Stretch with Strap  - 2 x daily - 7 x weekly - 1 sets - 3 reps - 30 sec hold - Prone Quadriceps Stretch with Strap  - 2 x daily - 7 x weekly - 1 sets - 3 reps - 30 sec hold - Clamshell with Resistance  - 1 x daily - 7 x weekly - 3 sets - 10 reps - Sidelying Bent Knee Lift at 45 Degrees  - 1 x daily - 7 x weekly - 3 sets - 10 reps - Beginner Sidelying Leg Circle  - 1 x daily - 7 x weekly - 3 sets - 10 reps - Gastroc Stretch on Wall  - 2 x daily - 7 x weekly - 1 sets - 3 reps - 30 sec  hold - Soleus Stretch on Wall  - 2 x daily - 7 x weekly - 1 sets - 3 reps - 30 sec  hold - Toe Yoga - Alternating Great Toe and  Lesser Toe Extension  - 2 x daily - 7 x weekly - 1 sets - 10 reps - 3 sec   hold - Wall Quarter Squat  - 2 x daily - 7 x weekly - 1-2 sets - 10 reps - 5-10 sec  hold - Standing Shoulder Row Reactive Isometric  - 2 x daily - 7 x weekly - 1 sets - 10 reps - 30-45 sec  hold   ASSESSMENT:  CLINICAL IMPRESSION: Patient demonstrated improved movement coordination with dead bug exercise, requiring significantly less cueing from therapist. Occasional cueing provided to improve hip hinge and postural alignment during resisted squat deadlifts. Discussion with patient on safe body mechanics during vacuuming to alleviate thoracolumbar tension and stress.   OBJECTIVE IMPAIRMENTS: decreased activity tolerance, decreased balance, decreased endurance, decreased mobility, difficulty walking, decreased ROM, decreased strength, hypomobility, increased muscle spasms, impaired flexibility, improper body mechanics, postural dysfunction, and pain.   ACTIVITY LIMITATIONS: carrying, lifting, bending, sitting, standing, squatting, stairs, transfers, bed mobility, and locomotion level  PARTICIPATION LIMITATIONS: meal prep, cleaning, laundry, shopping, community activity, and yard work  PERSONAL FACTORS: Age, Fitness, Past/current experiences, Profession, Time since onset of injury/illness/exacerbation, and comorbidities: prior lumbar and cervical fusions; psoriatic arthritis; arthritis; HTN; sedentary lifestyle are also affecting patient's functional outcome.   REHAB POTENTIAL: Good  CLINICAL DECISION MAKING: Stable/uncomplicated  EVALUATION COMPLEXITY: Low   GOALS: Goals reviewed with patient? Yes  SHORT TERM GOALS: Target date: 09/30/2022  Patient independent in initial HEP  Baseline: Goal status: MET on 09/28/22  2.  Patient demonstrates and reports improved ability to move from sit to stand and stand to sit  Baseline:  Goal status: MET on 09/28/22    LONG TERM GOALS: Target date: 10/28/2022  Increase lumbar mobility and ROM to WFL's with minimal to no pain  Baseline:  Goal  status: INITIAL  2.  Increase strength bilat LE's to 4+/5 to 5/5 throughout Baseline:  Goal status: INITIAL  3.  Decrease pain Rt LB Lt hip/groin 75% allowing patient to return to more normal functional activities  Baseline:  Goal status: INITIAL  4.  Patient demonstrates and reports independent and safe transfers from sit to stand and stand to sit as well as all bed mobility Baseline:  Goal status: INITIAL  5.  Independent in HEP including aquatic program as indicated  Baseline:  Goal status: INITIAL  6.  Improve functional limitation score to 57 Baseline: 42 Goal status: INITIAL  PLAN:  PT FREQUENCY: 2x/week  PT DURATION: 8 weeks  PLANNED INTERVENTIONS: Therapeutic exercises, Therapeutic activity, Neuromuscular re-education, Balance training, Gait training, Patient/Family education, Self Care, Joint mobilization, Aquatic Therapy, Dry Needling, Electrical stimulation, Cryotherapy, Moist heat, Taping, Ultrasound, Ionotophoresis 79m/ml Dexamethasone, Manual therapy, and Re-evaluation.  PLAN FOR NEXT SESSION: Progress exercises; Core stabilization and strengthening; Dynamic balance activities.   KHelane Gunther PTA 10/08/2022

## 2022-10-19 ENCOUNTER — Ambulatory Visit: Payer: Medicare Other

## 2022-10-19 DIAGNOSIS — M6281 Muscle weakness (generalized): Secondary | ICD-10-CM

## 2022-10-19 DIAGNOSIS — M5459 Other low back pain: Secondary | ICD-10-CM

## 2022-10-19 DIAGNOSIS — M4316 Spondylolisthesis, lumbar region: Secondary | ICD-10-CM | POA: Diagnosis not present

## 2022-10-19 DIAGNOSIS — R29898 Other symptoms and signs involving the musculoskeletal system: Secondary | ICD-10-CM

## 2022-10-19 NOTE — Therapy (Signed)
OUTPATIENT PHYSICAL THERAPY THORACOLUMBAR TREATMENT   Patient Name: Margaret Moody MRN: [id number] DOB:[birth year], 72 y.o., female Today's Date: 10/19/2022  END OF SESSION:  PT End of Session - 10/19/22 0934     Visit Number 10    Number of Visits 16    Date for PT Re-Evaluation 10/28/22    PT Start Time 0933    PT Stop Time 1016    PT Time Calculation (min) 43 min    Activity Tolerance Patient tolerated treatment well    Behavior During Therapy [facility] for tasks assessed/performed             Past Medical History:  Diagnosis Date   Anemia    Arthritis    Cancer ([place]) 2010   Breast   Diabetes mellitus without complication ([place])    Fatty liver    Fibromyalgia    GERD (gastroesophageal reflux disease)    Headache    migraines - takes Atenolol to prevent    Heart murmur    states she has not had an echo nor does she see a cardiologist   Past Surgical History:  Procedure Laterality Date   APPENDECTOMY  1972   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   EYE SURGERY Bilateral 2017   cataracts   MASTECTOMY Bilateral 2010   TONSILLECTOMY     removed when a child   [place]   Patient Active Problem List   Diagnosis Date Noted   S/P lumbar fusion 09/07/2019   Cervical spondylosis without myelopathy 04/10/2018   Depression with anxiety 03/07/2014   Malignant neoplasm of upper-outer quadrant of right female breast ([place]) 08/09/2011   Depression 07/19/2011    PCP: Dr Elenor Quinones  REFERRING PROVIDER: Dr Eustace Moore   REFERRING DIAG: Lumbar Spondylolisthesis  Rationale for Evaluation and Treatment: Rehabilitation  THERAPY DIAG:  Spondylolisthesis of lumbar region  Other low back pain  Other symptoms and signs involving the musculoskeletal system  Muscle weakness (generalized)  ONSET DATE: 01/28/22  SUBJECTIVE:                                                                                                                                                                                            SUBJECTIVE STATEMENT: Patient reports her low back is slightly sore after travelling and sitting on airplane.    PERTINENT HISTORY:  Lumbar and cervical spine surgeries; cervical surgery ~ 5 years ago due to neck pain with pain into both arms - good resolution of neck symptoms; lumbar fusion 2020 with "cage";  fall with fracture Lt wrist, scapula and dislocation  of Lt Stevenson joint ~ 1 year ago; ADOM; HTN; arthritis; psoriatic arthritis; osteoporosis   PAIN:  Are you having pain? Yes: NPRS scale: 0/10 Pain location: low back  Pain description: aching Aggravating factors: could be anything - worst moving sit >< stand; bending; getting in and out of car Relieving factors: time; lying down; medication   PRECAUTIONS: None  WEIGHT BEARING RESTRICTIONS: No  FALLS:  Has patient fallen in last 6 months? 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 Posterior and interbody fusion at the L5-S1 level. No findings to suggest canal stenosis. At least moderate right and mild left foraminal stenosis at this level. 2. Mild disc bulge at L4-L5 with possible mild left foraminal stenosis. 3. Aortic atherosclerosis (ICD10-I70.0).  PATIENT SURVEYS:  FOTO 42; goal 57 10/08/22: 63  MUSCLE LENGTH: Hamstrings: Right 65 deg; Left 55 deg  POSTURE: rounded shoulders, forward head, decreased lumbar lordosis, increased thoracic kyphosis, and flexed trunk   PALPATION: Muscular tightness Lt posterior hip through gluts, piriformis   LUMBAR ROM:   AROM eval  Flexion 65% pulling   Extension 20%   Right lateral flexion 65%  Left lateral flexion 50% discomfort Lt LB   Right rotation 20%  Left rotation 15%   (Blank rows = not tested)  LOWER EXTREMITY MMT:    MMT Right eval Left eval  Hip flexion 4+ 4+  Hip extension 4- 4  Hip abduction 4 4+  Hip adduction    Hip internal rotation    Hip external rotation    Knee flexion 5 5  Knee extension 4+ 4+  Ankle dorsiflexion    Ankle plantarflexion    Ankle inversion    Ankle eversion     (Blank rows = not tested)  LUMBAR SPECIAL TESTS:  Straight leg raise test: Negative and Slump test: Negative  FUNCTIONAL TESTS:  From Eval: Unable to achieve SLS without UE support  09/07/22: STSx5 - 35 sec (hands braced on thighs)  GAIT: Distance walked: 40 Assistive device utilized: None Level of assistance: Complete Independence Comments: wide based support with decreased stance phase Lt LE; decreased balance   TODAY'S TREATMENT:   [facility] Adult PT Treatment:                                                DATE: 10/19/2022 Therapeutic Exercise: NuStep L5 x 65mn + subjective intake Seated SB roll out front & diagonals 10x5" each SKTC 4x10" each --> DKTC 5x10" Dead bug x10 S/L hip abd +IR x12 B Staggered stance bridges x10 each  Seated deadlift 10#KB x10  Standing deadlift 10#KB (focus on hip hinge mechanics) Paloff press BTB x15 B pTandem added slow R/L HT 2x10" B Staggered stance A/P weight shifting (focus on equal weight bearing in heel/toes)    [facility] Adult PT Treatment:  DATE: 10/08/2022 Therapeutic Exercise: NuStep L6 x 54mn SKTC --> DKTC Dead bug x10 Seated deadlift 8#DB x10 Standing paloff press BTB x12 B Hip hinge with dowel --> squat deadlift 8#DB on stool (focus on hip hinge mechanics) Sled push/pull 0# 2L --> 20# 2L --> 30# 2L                                                                                                                       PATIENT EDUCATION:   Education details: VAeronautical engineer Person educated: Patient Education method: Explanation, Demonstration, Tactile cues, Verbal cues, and Handouts Education comprehension: verbalized understanding, returned demonstration, verbal cues required, tactile cues required, and needs further education  HOME EXERCISE PROGRAM: Access Code: [email or web address]: [email or web address] Date: 09/21/2022 Prepared by: CGillermo Murdoch Exercises - Prone Press Up On Elbows  - 2 x daily - 7 x weekly - 1 sets - 3 reps - 30 sec  hold - Supine Transversus Abdominis Bracing with Pelvic Floor Contraction  - 2 x daily - 7 x weekly - 1 sets - 10 reps - 10sec  hold - Hooklying Isometric Clamshell  - 2 x daily - 7 x weekly - 1 sets - 10 reps - 3 sec  hold - Wall Quarter Squat  - 2 x daily - 7 x weekly - 1-2 sets - 10 reps - 5-10 sec  hold - Prone Hip Extension on Table  - 1 x daily - 7 x weekly - 3 sets - 10 reps - Sit to Stand with Hands on Knees  - 1 x daily - 7 x weekly - 3 sets - 10 reps - Supine March  - 1 x daily - 7 x weekly - 3 sets - 10 reps - Hooklying Hamstring Stretch with Strap  - 2 x daily - 7 x weekly - 1 sets - 3 reps - 30 sec hold - Prone Quadriceps Stretch with Strap  - 2 x daily - 7 x weekly - 1 sets - 3 reps - 30 sec hold - Clamshell with Resistance  - 1 x daily - 7 x weekly - 3 sets - 10 reps - Sidelying Bent Knee Lift at 45 Degrees  - 1 x daily - 7 x weekly - 3 sets - 10 reps - Beginner Sidelying Leg Circle  - 1 x daily - 7 x weekly - 3 sets - 10 reps - Gastroc Stretch on Wall  - 2 x daily - 7 x weekly - 1 sets - 3 reps - 30 sec  hold - Soleus Stretch on Wall  - 2 x daily - 7 x weekly - 1 sets - 3 reps - 30 sec  hold - Toe Yoga - Alternating Great Toe and Lesser Toe Extension  - 2 x daily - 7 x weekly - 1 sets - 10 reps - 3 sec  hold - Wall Quarter Squat  - 2 x daily - 7 x weekly - 1-2 sets - 10 reps -  5-10 sec  hold - Standing Shoulder Row Reactive Isometric  - 2 x daily - 7 x  weekly - 1 sets - 10 reps - 30-45 sec  hold   ASSESSMENT:  CLINICAL IMPRESSION: Patient demonstrated improved postural alignment and hip hinge mechanics during seated and standing dead lifts. Initial moderate postural demonstrated during tandem stance balance, which improved to mild sway and good ankle strategy over time.   OBJECTIVE IMPAIRMENTS: decreased activity tolerance, decreased balance, decreased endurance, decreased mobility, difficulty walking, decreased ROM, decreased strength, hypomobility, increased muscle spasms, impaired flexibility, improper body mechanics, postural dysfunction, and pain.   ACTIVITY LIMITATIONS: carrying, lifting, bending, sitting, standing, squatting, stairs, transfers, bed mobility, and locomotion level  PARTICIPATION LIMITATIONS: meal prep, cleaning, laundry, shopping, community activity, and yard work  PERSONAL FACTORS: Age, Fitness, Past/current experiences, Profession, Time since onset of injury/illness/exacerbation, and comorbidities: prior lumbar and cervical fusions; psoriatic arthritis; arthritis; HTN; sedentary lifestyle are also affecting patient's functional outcome.   REHAB POTENTIAL: Good  CLINICAL DECISION MAKING: Stable/uncomplicated  EVALUATION COMPLEXITY: Low   GOALS: Goals reviewed with patient? Yes  SHORT TERM GOALS: Target date: 09/30/2022  Patient independent in initial HEP  Baseline: Goal status: MET on 09/28/22  2.  Patient demonstrates and reports improved ability to move from sit to stand and stand to sit  Baseline:  Goal status: MET on 09/28/22    LONG TERM GOALS: Target date: 10/28/2022  Increase lumbar mobility and ROM to WFL's with minimal to no pain  Baseline:  Goal status: INITIAL  2.  Increase strength bilat LE's to 4+/5 to 5/5 throughout Baseline:  Goal status: INITIAL  3.  Decrease pain Rt LB Lt hip/groin 75% allowing patient to return to more normal functional activities  Baseline:  Goal status:  INITIAL  4.  Patient demonstrates and reports independent and safe transfers from sit to stand and stand to sit as well as all bed mobility Baseline:  Goal status: INITIAL  5.  Independent in HEP including aquatic program as indicated  Baseline:  Goal status: INITIAL  6.  Improve functional limitation score to 57 Baseline: 42 Goal status: INITIAL  PLAN:  PT FREQUENCY: 2x/week  PT DURATION: 8 weeks  PLANNED INTERVENTIONS: Therapeutic exercises, Therapeutic activity, Neuromuscular re-education, Balance training, Gait training, Patient/Family education, Self Care, Joint mobilization, Aquatic Therapy, Dry Needling, Electrical stimulation, Cryotherapy, Moist heat, Taping, Ultrasound, Ionotophoresis 56m/ml Dexamethasone, Manual therapy, and Re-evaluation.  PLAN FOR NEXT SESSION: Progress exercises; Core stabilization and strengthening; Dynamic balance activities.   KHelane Gunther PTA 10/19/2022

## 2022-10-26 ENCOUNTER — Ambulatory Visit: Payer: Medicare Other

## 2022-10-26 DIAGNOSIS — M6281 Muscle weakness (generalized): Secondary | ICD-10-CM

## 2022-10-26 DIAGNOSIS — M4316 Spondylolisthesis, lumbar region: Secondary | ICD-10-CM

## 2022-10-26 DIAGNOSIS — R29898 Other symptoms and signs involving the musculoskeletal system: Secondary | ICD-10-CM

## 2022-10-26 DIAGNOSIS — M5459 Other low back pain: Secondary | ICD-10-CM

## 2022-10-26 NOTE — Therapy (Signed)
OUTPATIENT PHYSICAL THERAPY THORACOLUMBAR TREATMENT   Patient Name: Margaret Moody MRN: [id number] DOB:Jan 21, 1951, 72 y.o., female Today's Date: 10/26/2022  END OF SESSION:  PT End of Session - 10/26/22 0922     Visit Number 11    Number of Visits 16    Date for PT Re-Evaluation 10/28/22    PT Start Time 0925    PT Stop Time 1012    PT Time Calculation (min) 47 min    Activity Tolerance Patient tolerated treatment well    Behavior During Therapy [facility] for tasks assessed/performed             Past Medical History:  Diagnosis Date   Anemia    Arthritis    Cancer ([place]) 2010   Breast   Diabetes mellitus without complication ([place])    Fatty liver    Fibromyalgia    GERD (gastroesophageal reflux disease)    Headache    migraines - takes Atenolol to prevent    Heart murmur    states she has not had an echo nor does she see a cardiologist   Past Surgical History:  Procedure Laterality Date   APPENDECTOMY  1972   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   EYE SURGERY Bilateral 2017   cataracts   MASTECTOMY Bilateral 2010   TONSILLECTOMY     removed when a child   [place]   Patient Active Problem List   Diagnosis Date Noted   S/P lumbar fusion 09/07/2019   Cervical spondylosis without myelopathy 04/10/2018   Depression with anxiety 03/07/2014   Malignant neoplasm of upper-outer quadrant of right female breast ([place]) 08/09/2011   Depression 07/19/2011    PCP: Dr Elenor Quinones  REFERRING PROVIDER: Dr Eustace Moore   REFERRING DIAG: Lumbar Spondylolisthesis  Rationale for Evaluation and Treatment: Rehabilitation  THERAPY DIAG:  Spondylolisthesis of lumbar region  Other low back pain  Other symptoms and signs involving the musculoskeletal system  Muscle weakness (generalized)  ONSET DATE: 01/28/22  SUBJECTIVE:                                                                                                                                                                                            SUBJECTIVE STATEMENT: Patient reports her low back is hurting today, R>L after her basket weaving convention and sitting in the car. Patient reports 8/10 pain in low back today.    PERTINENT HISTORY:  Lumbar and cervical spine surgeries; cervical surgery ~ 5 years ago due to neck pain with pain into both arms - good resolution of neck symptoms; lumbar  fusion 2020 with "cage";  fall with fracture Lt wrist, scapula and dislocation of Lt GH joint ~ 1 year ago; ADOM; HTN; arthritis; psoriatic arthritis; osteoporosis   PAIN:  Are you having pain? Yes: NPRS scale: 0/10 Pain location: low back  Pain description: aching Aggravating factors: could be anything - worst moving sit >< stand; bending; getting in and out of car Relieving factors: time; lying down; medication   PRECAUTIONS: None  WEIGHT BEARING RESTRICTIONS: No  FALLS:  Has patient fallen in last 6 months? 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 Posterior and interbody fusion at the L5-S1 level. No findings to suggest canal stenosis. At least moderate right and mild left foraminal stenosis at this level. 2. Mild disc bulge at L4-L5 with possible mild left foraminal stenosis. 3. Aortic atherosclerosis (ICD10-I70.0).  PATIENT SURVEYS:  FOTO 42; goal 57 10/08/22: 63 10/26/22: 72  MUSCLE LENGTH: Hamstrings: Right 65 deg; Left 55 deg  POSTURE: rounded shoulders, forward head, decreased lumbar lordosis, increased thoracic kyphosis, and flexed trunk   PALPATION: Muscular tightness Lt posterior hip through gluts,  piriformis   LUMBAR ROM:   AROM eval  Flexion 65% pulling   Extension 20%  Right lateral flexion 65%  Left lateral flexion 50% discomfort Lt LB   Right rotation 20%  Left rotation 15%   (Blank rows = not tested)  LOWER EXTREMITY MMT:    MMT Right eval Left eval Right  10/26/22 Left 10/26/22   Hip flexion 4+ 4+    Hip extension 4- 4    Hip abduction 4 4+    Hip adduction      Hip internal rotation      Hip external rotation      Knee flexion 5 5    Knee extension 4+ 4+    Ankle dorsiflexion      Ankle plantarflexion      Ankle inversion      Ankle eversion       (Blank rows = not tested)  LUMBAR SPECIAL TESTS:  Straight leg raise test: Negative and Slump test: Negative  FUNCTIONAL TESTS:  From Eval: Unable to achieve SLS without UE support  09/07/22: STSx5 - 35 sec (hands braced on thighs)  GAIT: Distance walked: 40 Assistive device utilized: None Level of assistance: Complete Independence Comments: wide based support with decreased stance phase Lt LE; decreased balance   TODAY'S TREATMENT:   [facility] Adult PT Treatment:                                                DATE: 10/26/2022 Therapeutic Exercise: Mat Table (moist heat on low back): SKTC 4x20" B DKTC 5x10" HS/ITB stretch w/strap 2x30" B Figure 4 LTR x10 B Dead bug x15 Staggered stance bridges x10 each foot forward Seated SB roll out + FOTO intake Staggered stance paloff press GTB x15 B Shoulder extension GTB x10 --> added head turn x10 Standing deadlift 10#KB (focus on hip hinge mechanics)  Fwd marching +  10#KB suitcase carry 2L    [facility] Adult PT Treatment:                                                DATE: 10/19/2022 Therapeutic Exercise: NuStep L5 x 42mn + subjective intake Seated SB roll out front & diagonals 10x5" each SKTC 4x10" each --> DKTC 5x10" Dead bug x10 S/L hip abd +IR x12 B Staggered stance bridges x10 each  Seated deadlift 10#KB x10  Standing deadlift 10#KB (focus on hip hinge  mechanics) Paloff press BTB x15 B pTandem added slow R/L HT 2x10" B Staggered stance A/P weight shifting (focus on equal weight bearing in heel/toes)                                                                                                                       PATIENT EDUCATION:  Education details: VAeronautical engineer Person educated: Patient Education method: Explanation, Demonstration, Tactile cues, Verbal cues, and Handouts Education comprehension: verbalized understanding, returned demonstration, verbal cues required, tactile cues required, and needs further education  HOME EXERCISE PROGRAM: Access Code: [email or web address]: [email or web address] Date: 10/26/2022 Prepared by: KHelane Gunther Exercises - Prone Press Up On Elbows  - 2 x daily - 7 x weekly - 1 sets - 3 reps - 30 sec  hold - Supine Transversus Abdominis Bracing with Pelvic Floor Contraction  - 2 x daily - 7 x weekly - 1 sets - 10 reps - 10sec  hold - Wall Quarter Squat  - 2 x daily - 7 x weekly - 1-2 sets - 10 reps - 5-10 sec  hold - Prone Hip Extension on Table  - 1 x daily - 7 x weekly - 3 sets - 10 reps - Sit to Stand with Hands on Knees  - 1 x daily - 7 x weekly - 3 sets - 10 reps - Supine March  - 1 x daily - 7 x weekly - 3 sets - 10 reps - Prone Quadriceps Stretch with Strap  - 2 x daily - 7 x weekly - 1 sets - 3 reps - 30 sec hold - Clamshell with Resistance  - 1 x daily - 7 x weekly - 3 sets - 10 reps - Sidelying Bent Knee Lift at 45 Degrees  - 1 x daily - 7 x weekly - 3 sets - 10 reps - Beginner Sidelying Leg Circle  - 1 x daily - 7 x weekly - 3 sets - 10 reps - Gastroc Stretch on Wall  - 2 x daily - 7 x weekly - 1 sets - 3 reps - 30 sec  hold - Soleus Stretch on Wall  - 2 x daily - 7 x weekly - 1 sets - 3 reps - 30 sec  hold - Toe Yoga - Alternating Great Toe and Lesser Toe Extension  -  2 x daily - 7 x weekly - 1 sets - 10 reps - 3 sec  hold - Wall Quarter Squat  - 2 x daily - 7 x weekly -  1-2 sets - 10 reps - 5-10 sec  hold - Standing Shoulder Row Reactive Isometric  - 2 x daily - 7 x weekly - 1 sets - 10 reps - 30-45 sec  hold - Shoulder Extension with Resistance  - 1 x daily - 7 x weekly - 3 sets - 10 reps - Supine Dead Bug with Leg Extension  - 1 x daily - 7 x weekly - 3 sets - 10 reps - Standing Anti-Rotation Press with Anchored Resistance  - 1 x daily - 7 x weekly - 3 sets - 10 reps - Standing Lumbar Extension with Counter  - 1 x daily - 7 x weekly - 3 sets - 10 reps - Forward T with Counter Support  - 1 x daily - 7 x weekly - 3 sets - 10 reps - Tandem Stance  - 1 x daily - 7 x weekly - 3 sets - 10 reps - Tandem Walking  - 1 x daily - 7 x weekly - 3 sets - 10 reps   ASSESSMENT:  CLINICAL IMPRESSION: Moist heat applied during supine stretches and exercises to alleviate tension and decrease pain symptoms. Mild postural sway during marching with unilateral kettlebell carry; patient able to regain balance with stepping and hip strategies. Patient continues to require occasional verbal cueing to improve hip hinge mechanics with resisted deadlifts.    OBJECTIVE IMPAIRMENTS: decreased activity tolerance, decreased balance, decreased endurance, decreased mobility, difficulty walking, decreased ROM, decreased strength, hypomobility, increased muscle spasms, impaired flexibility, improper body mechanics, postural dysfunction, and pain.   ACTIVITY LIMITATIONS: carrying, lifting, bending, sitting, standing, squatting, stairs, transfers, bed mobility, and locomotion level  PARTICIPATION LIMITATIONS: meal prep, cleaning, laundry, shopping, community activity, and yard work  PERSONAL FACTORS: Age, Fitness, Past/current experiences, Profession, Time since onset of injury/illness/exacerbation, and comorbidities: prior lumbar and cervical fusions; psoriatic arthritis; arthritis; HTN; sedentary lifestyle are also affecting patient's functional outcome.   REHAB POTENTIAL: Good  CLINICAL  DECISION MAKING: Stable/uncomplicated  EVALUATION COMPLEXITY: Low   GOALS: Goals reviewed with patient? Yes  SHORT TERM GOALS: Target date: 09/30/2022  Patient independent in initial HEP  Baseline: Goal status: MET on 09/28/22  2.  Patient demonstrates and reports improved ability to move from sit to stand and stand to sit  Baseline:  Goal status: MET on 09/28/22    LONG TERM GOALS: Target date: 10/28/2022  Increase lumbar mobility and ROM to WFL's with minimal to no pain  Baseline:  Goal status: INITIAL  2.  Increase strength bilat LE's to 4+/5 to 5/5 throughout Baseline:  Goal status: INITIAL  3.  Decrease pain Rt LB Lt hip/groin 75% allowing patient to return to more normal functional activities  Baseline:  Goal status: INITIAL  4.  Patient demonstrates and reports independent and safe transfers from sit to stand and stand to sit as well as all bed mobility Baseline:  Goal status: MET on 10/26/22  5.  Independent in HEP including aquatic program as indicated  Baseline:  Goal status: MET on 10/26/22  6.  Improve functional limitation score to 57 Baseline: 42 Goal status: MET (72) on 10/26/22  PLAN:  PT FREQUENCY: 2x/week  PT DURATION: 8 weeks  PLANNED INTERVENTIONS: Therapeutic exercises, Therapeutic activity, Neuromuscular re-education, Balance training, Gait training, Patient/Family education, Self Care, Joint mobilization, Aquatic  Therapy, Dry Needling, Electrical stimulation, Cryotherapy, Moist heat, Taping, Ultrasound, Ionotophoresis '4mg'$ /ml Dexamethasone, Manual therapy, and Re-evaluation.  PLAN FOR NEXT SESSION: Discharge next visit --> update HEP   Helane Gunther, PTA 10/26/2022 10:01

## 2022-10-28 ENCOUNTER — Ambulatory Visit: Payer: Medicare Other

## 2022-10-28 DIAGNOSIS — R29898 Other symptoms and signs involving the musculoskeletal system: Secondary | ICD-10-CM

## 2022-10-28 DIAGNOSIS — M6281 Muscle weakness (generalized): Secondary | ICD-10-CM

## 2022-10-28 DIAGNOSIS — M5459 Other low back pain: Secondary | ICD-10-CM

## 2022-10-28 DIAGNOSIS — M4316 Spondylolisthesis, lumbar region: Secondary | ICD-10-CM | POA: Diagnosis not present

## 2022-10-28 NOTE — Addendum Note (Signed)
Addended by: Everardo All on: 10/28/2022 10:39 AM   Modules accepted: Orders

## 2022-10-28 NOTE — Therapy (Addendum)
OUTPATIENT PHYSICAL THERAPY THORACOLUMBAR TREATMENT   Patient Name: Margaret Moody MRN: [id number] DOB:11-26-1950, 72 y.o., female Today's Date: 10/28/2022  END OF SESSION:  PT End of Session - 10/28/22 0940     Visit Number 12    Number of Visits 22    Date for PT Re-Evaluation 12/09/22    PT Start Time 0935    PT Stop Time 1020    PT Time Calculation (min) 45 min             Past Medical History:  Diagnosis Date   Anemia    Arthritis    Cancer ([place]) 2010   Breast   Diabetes mellitus without complication ([place])    Fatty liver    Fibromyalgia    GERD (gastroesophageal reflux disease)    Headache    migraines - takes Atenolol to prevent    Heart murmur    states she has not had an echo nor does she see a cardiologist   Past Surgical History:  Procedure Laterality Date   APPENDECTOMY  1972   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   EYE SURGERY Bilateral 2017   cataracts   MASTECTOMY Bilateral 2010   TONSILLECTOMY     removed when a child   [place]   Patient Active Problem List   Diagnosis Date Noted   S/P lumbar fusion 09/07/2019   Cervical spondylosis without myelopathy 04/10/2018   Depression with anxiety 03/07/2014   Malignant neoplasm of upper-outer quadrant of right female breast ([place]) 08/09/2011   Depression 07/19/2011    PCP: Dr Elenor Quinones  REFERRING PROVIDER: Dr Eustace Moore   REFERRING DIAG: Lumbar Spondylolisthesis  Rationale for Evaluation and Treatment: Rehabilitation  THERAPY DIAG:  Spondylolisthesis of lumbar region  Other low back pain  Other symptoms and signs involving the musculoskeletal system  Muscle weakness (generalized)  ONSET DATE: 01/28/22  SUBJECTIVE:                                                                                                                                                                                           SUBJECTIVE STATEMENT: Patient reports continued 8/10 pain on R  side of low back, states she thinks the pain came on a day or two after her travels.   PERTINENT HISTORY:  Lumbar and cervical spine surgeries; cervical surgery ~ 5 years ago due to neck pain with pain into both arms - good resolution of neck symptoms; lumbar fusion 2020 with "cage";  fall with fracture Lt wrist, scapula and dislocation of Lt GH joint ~ 1 year ago; ADOM;  HTN; arthritis; psoriatic arthritis; osteoporosis   PAIN:  Are you having pain? Yes: NPRS scale: 0/10 Pain location: low back  Pain description: aching Aggravating factors: could be anything - worst moving sit >< stand; bending; getting in and out of car Relieving factors: time; lying down; medication   PRECAUTIONS: None  WEIGHT BEARING RESTRICTIONS: No  FALLS:  Has patient fallen in last 6 months? 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 Posterior and interbody fusion at the L5-S1 level. No findings to suggest canal stenosis. At least moderate right and mild left foraminal stenosis at this level. 2. Mild disc bulge at L4-L5 with possible mild left foraminal stenosis. 3. Aortic atherosclerosis (ICD10-I70.0).  PATIENT SURVEYS:  FOTO 42; goal 57 10/08/22: 63 10/26/22: 72  MUSCLE LENGTH: Hamstrings: Right 65 deg; Left 55 deg  POSTURE: rounded shoulders, forward head, decreased lumbar lordosis, increased thoracic kyphosis, and flexed trunk   PALPATION: Muscular tightness Lt posterior hip through gluts, piriformis   LUMBAR ROM:   AROM eval 10/28/22  Flexion 65% pulling  70%   Extension 20% 20%  Right lateral flexion 65% 70%  Left lateral flexion  50% discomfort Lt LB  65% R side sharp pain  Right rotation 20% 20%  Left rotation 15% 20%   (Blank rows = not tested)  LOWER EXTREMITY MMT:    MMT Right eval Left eval Right  10/28/22 Left 10/28/22   Hip flexion 4+ 4+ 4 4+  Hip extension 4- 4 4- 4-  Hip abduction 4 4+ 4- 4+  Hip adduction      Hip internal rotation      Hip external rotation      Knee flexion 5 5    Knee extension 4+ 4+ 4+ 4+  Ankle dorsiflexion      Ankle plantarflexion      Ankle inversion      Ankle eversion       (Blank rows = not tested)  LUMBAR SPECIAL TESTS:  Straight leg raise test: Negative and Slump test: Negative  FUNCTIONAL TESTS:  From Eval: Unable to achieve SLS without UE support  09/07/22: STSx5 - 35 sec (hands braced on thighs)  GAIT: Distance walked: 40 Assistive device utilized: None Level of assistance: Complete Independence Comments: wide based support with decreased stance phase Lt LE; decreased balance   TODAY'S TREATMENT:   [facility] Adult PT Treatment:                                                DATE: 10/28/2022 Therapeutic Exercise: NuStep L6 x 61mn Hip & knee MMT Lumbar mobility testing Supine (moist heat on low back): 3-way HS stretch w/strap 2x30" each B Break the stick 3x10" B Figure 4 glute stretch 3x30" (R side, L leg propped on orange SB) HS curls legs on orange SB x10 Dead bug Thomas stretch (R) Re-test of lumbar forward & lateral flexion    [facility] Adult PT Treatment:  DATE: 10/26/2022 Therapeutic Exercise: Mat Table (moist heat on low back): SKTC 4x20" B DKTC 5x10" HS/ITB stretch w/strap 2x30" B Figure 4 LTR x10 B Dead bug x15 Staggered stance bridges x10 each foot forward Seated SB roll out + FOTO intake Staggered stance paloff press GTB x15 B Shoulder extension GTB x10 --> added head turn x10 Standing deadlift 10#KB (focus on hip hinge mechanics)  Fwd marching + 10#KB suitcase carry 2L                                                                                                                         PATIENT EDUCATION:  Education details: Aeronautical engineer  Person educated: Patient Education method: Explanation, Demonstration, Tactile cues, Verbal cues, and Handouts Education comprehension: verbalized understanding, returned demonstration, verbal cues required, tactile cues required, and needs further education  HOME EXERCISE PROGRAM: Access Code: [email or web address] URL: [email or web address] Date: 10/28/2022 Prepared by: Helane Gunther  Exercises - Supine Dead Bug with Leg Extension  - 1 x daily - 7 x weekly - 3 sets - 10 reps - Clamshell with Resistance  - 1 x daily - 7 x weekly - 3 sets - 10 reps - Sidelying Hip Abduction and Extension with Loop Band  - 1 x daily - 7 x weekly - 3 sets - 10 reps - Beginner Sidelying Leg Circle  - 1 x daily - 7 x weekly - 3 sets - 10 reps - Figure 4 Bridge  - 1 x daily - 7 x weekly - 3 sets - 10 reps - Wall Quarter Squat  - 2 x daily - 7 x weekly - 1-2 sets - 10 reps - 5-10 sec  hold - Modified Single-Leg Deadlift  - 1 x daily - 7 x weekly - 3 sets - 10 reps - Standing Row with Anchored Resistance  - 1 x daily - 7 x weekly - 3 sets - 10 reps - Shoulder Extension with Resistance  - 1 x daily - 7 x weekly - 3 sets - 10 reps - Standing Anti-Rotation Press with Anchored Resistance  - 1 x daily - 7 x weekly - 3 sets - 10 reps - Tandem Stance  - 1 x daily - 7 x weekly - 3 sets - 10 reps - Tandem Walking  - 1 x daily - 7 x weekly - 3 sets - 10 reps - Prone on Elbows Stretch  - 1 x daily - 7 x weekly - 3 sets - 10 reps - Seated Thoracic Lumbar Extension with Pectoralis Stretch  - 1 x daily - 7 x weekly - 1 sets - 10 reps - 5-10sec hold - Prone Quadriceps Stretch with Strap  - 2 x daily - 7 x weekly - 1 sets - 3 reps - 30 sec hold - Seated Figure 4 Piriformis Stretch  - 1 x daily - 7 x weekly - 3 sets - 10 reps - Standing Lumbar Extension with  Counter  - 1 x daily -  7 x weekly - 1 sets - 5 reps - 10-20 sec hold - Thomas Stretch  - 1 x daily - 7 x weekly - 3 sets - 10 reps  ASSESSMENT:  CLINICAL IMPRESSION: Patient continues to have increased pain on R side of lumbar spine with trunk flexion to L side. Regression in R sided low back pain as compared to progress prior to travel for weaving convention two weeks ago. Improved lumbar flexion ROM demonstrated after stretching and core strengthening exercises noted at end of session. Patient will continue to benefit from skilled therapy to address increased lumbar pain symptoms and progress core and postural strength to improve ease of movement and QOL.   OBJECTIVE IMPAIRMENTS: decreased activity tolerance, decreased balance, decreased endurance, decreased mobility, difficulty walking, decreased ROM, decreased strength, hypomobility, increased muscle spasms, impaired flexibility, improper body mechanics, postural dysfunction, and pain.   ACTIVITY LIMITATIONS: carrying, lifting, bending, sitting, standing, squatting, stairs, transfers, bed mobility, and locomotion level  PARTICIPATION LIMITATIONS: meal prep, cleaning, laundry, shopping, community activity, and yard work  PERSONAL FACTORS: Age, Fitness, Past/current experiences, Profession, Time since onset of injury/illness/exacerbation, and comorbidities: prior lumbar and cervical fusions; psoriatic arthritis; arthritis; HTN; sedentary lifestyle are also affecting patient's functional outcome.   REHAB POTENTIAL: Good  CLINICAL DECISION MAKING: Stable/uncomplicated  EVALUATION COMPLEXITY: Low   GOALS: Goals reviewed with patient? Yes  SHORT TERM GOALS: Target date: 09/30/2022  Patient independent in initial HEP  Baseline: Goal status: MET on 09/28/22  2.  Patient demonstrates and reports improved ability to move from sit to stand and stand to sit  Baseline:  Goal status: MET on 09/28/22    LONG TERM GOALS: Target date:  12/09/2022  Increase lumbar mobility and ROM to WFL's with minimal to no pain  Baseline:  Goal status: In PROGRESS (sharp pain on R side with lateral flexion to L) on 10/28/22  2.  Increase strength bilat LE's to 4+/5 to 5/5 throughout Baseline:  Goal status: In PROGRESS (regression in R hip abd strength) on 10/28/22  3.  Decrease pain Rt LB Lt hip/groin 75% allowing patient to return to more normal functional activities  Baseline:  Goal status: IN PROGRESS (65% improvement) on 10/28/22  4.  Patient demonstrates and reports independent and safe transfers from sit to stand and stand to sit as well as all bed mobility Baseline:  Goal status: MET on 10/26/22  5.  Independent in HEP including aquatic program as indicated  Baseline:  Goal status: MET on 10/26/22  6.  Improve functional limitation score to 57 Baseline: 42 Goal status: MET (72) on 10/26/22  PLAN:  PT FREQUENCY: 2x/week  PT DURATION: 8 weeks  PLANNED INTERVENTIONS: Therapeutic exercises, Therapeutic activity, Neuromuscular re-education, Balance training, Gait training, Patient/Family education, Self Care, Joint mobilization, Aquatic Therapy, Dry Needling, Electrical stimulation, Cryotherapy, Moist heat, Taping, Ultrasound, Ionotophoresis '4mg'$ /ml Dexamethasone, Manual therapy, and Re-evaluation.  PLAN FOR NEXT SESSION: Glute & hip/posterior chain strengthening; progress core stabilization   Helane Gunther, PTA 10/28/2022 10:01

## 2022-11-02 ENCOUNTER — Ambulatory Visit: Payer: Medicare Other | Attending: Neurological Surgery

## 2022-11-02 DIAGNOSIS — R29898 Other symptoms and signs involving the musculoskeletal system: Secondary | ICD-10-CM | POA: Insufficient documentation

## 2022-11-02 DIAGNOSIS — M4316 Spondylolisthesis, lumbar region: Secondary | ICD-10-CM | POA: Diagnosis present

## 2022-11-02 DIAGNOSIS — M5459 Other low back pain: Secondary | ICD-10-CM | POA: Insufficient documentation

## 2022-11-02 DIAGNOSIS — M6281 Muscle weakness (generalized): Secondary | ICD-10-CM | POA: Diagnosis present

## 2022-11-02 NOTE — Therapy (Signed)
OUTPATIENT PHYSICAL THERAPY THORACOLUMBAR TREATMENT   Patient Name: Margaret Moody MRN: [id number] DOB:04-13-1951, 72 y.o., female Today's Date: 11/02/2022  END OF SESSION:  PT End of Session - 11/02/22 1139     Visit Number 13    Number of Visits 22    Date for PT Re-Evaluation 12/09/22    PT Start Time 1143    PT Stop Time 1223    PT Time Calculation (min) 40 min    Activity Tolerance Patient tolerated treatment well    Behavior During Therapy WFL for tasks assessed/performed             Past Medical History:  Diagnosis Date   Anemia    Arthritis    Cancer ([place]) 2010   Breast   Diabetes mellitus without complication ([place])    Fatty liver    Fibromyalgia    GERD (gastroesophageal reflux disease)    Headache    migraines - takes Atenolol to prevent    Heart murmur    states she has not had an echo nor does she see a cardiologist   Past Surgical History:  Procedure Laterality Date   APPENDECTOMY  1972   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   EYE SURGERY Bilateral 2017   cataracts   MASTECTOMY Bilateral 2010   TONSILLECTOMY     removed when a child   [place]   Patient Active Problem List   Diagnosis Date Noted   S/P lumbar fusion 09/07/2019   Cervical spondylosis without myelopathy 04/10/2018   Depression with anxiety 03/07/2014   Malignant neoplasm of upper-outer quadrant of right female breast ([place]) 08/09/2011   Depression 07/19/2011    PCP: Dr Elenor Quinones  REFERRING PROVIDER: Dr Eustace Moore   REFERRING DIAG: Lumbar Spondylolisthesis  Rationale for Evaluation and Treatment: Rehabilitation  THERAPY DIAG:  Spondylolisthesis of lumbar region  Other low back pain  Other symptoms and signs involving the musculoskeletal system  ONSET DATE: 01/28/22  SUBJECTIVE:                                                                                                                                                                                            SUBJECTIVE STATEMENT: Patient reports she is feeling much better today, states she has minimal pain in low back today.    PERTINENT HISTORY:  Lumbar and cervical spine surgeries; cervical surgery ~ 5 years ago due to neck pain with pain into both arms - good resolution of neck symptoms; lumbar fusion 2020 with "cage";  fall with fracture Lt wrist, scapula and dislocation of  Lt GH joint ~ 1 year ago; ADOM; HTN; arthritis; psoriatic arthritis; osteoporosis   PAIN:  Are you having pain? Yes: NPRS scale: 0/10 Pain location: low back  Pain description: aching Aggravating factors: could be anything - worst moving sit >< stand; bending; getting in and out of car Relieving factors: time; lying down; medication   PRECAUTIONS: None  WEIGHT BEARING RESTRICTIONS: No  FALLS:  Has patient fallen in last 6 months? 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 Posterior and interbody fusion at the L5-S1 level. No findings to suggest canal stenosis. At least moderate right and mild left foraminal stenosis at this level. 2. Mild disc bulge at L4-L5 with possible mild left foraminal stenosis. 3. Aortic atherosclerosis (ICD10-I70.0).  PATIENT SURVEYS:  FOTO 42; goal 57 10/08/22: 63 10/26/22: 72  MUSCLE LENGTH: Hamstrings: Right 65 deg; Left 55 deg  POSTURE: rounded shoulders, forward head, decreased lumbar lordosis, increased thoracic kyphosis, and flexed trunk   PALPATION: Muscular tightness Lt posterior hip through gluts, piriformis   LUMBAR ROM:   AROM eval 10/28/22  Flexion 65% pulling  70%   Extension 20% 20%   Right lateral flexion 65% 70%  Left lateral flexion 50% discomfort Lt LB  65% R side sharp pain  Right rotation 20% 20%  Left rotation 15% 20%   (Blank rows = not tested)  LOWER EXTREMITY MMT:    MMT Right eval Left eval Right  10/28/22 Left 10/28/22   Hip flexion 4+ 4+ 4 4+  Hip extension 4- 4 4- 4-  Hip abduction 4 4+ 4- 4+  Hip adduction      Hip internal rotation      Hip external rotation      Knee flexion 5 5    Knee extension 4+ 4+ 4+ 4+  Ankle dorsiflexion      Ankle plantarflexion      Ankle inversion      Ankle eversion       (Blank rows = not tested)  LUMBAR SPECIAL TESTS:  Straight leg raise test: Negative and Slump test: Negative  FUNCTIONAL TESTS:  From Eval: Unable to achieve SLS without UE support  09/07/22: STSx5 - 35 sec (hands braced on thighs)  GAIT: Distance walked: 40 Assistive device utilized: None Level of assistance: Complete Independence Comments: wide based support with decreased stance phase Lt LE; decreased balance    TODAY'S TREATMENT:   [facility] Adult PT Treatment:                                                DATE: 11/02/2022 Therapeutic Exercise: NuStep L7 x 5 min Deadbug x15 S/L straight leg hip add (ball squeeze) & hip abd (RTB at ankles) Figure 4 glute stretch x30" B DKTC rolling OSB x10 Straight leg bridges on OSB x10 Thomas stretch x30" B Resisted walking (pulleys, 5#): fwd/bkwd/side stepping (close SBA) Tandem static balance x30" B --> added slow R/L head turns --> slow head nods up    [facility]  Adult PT Treatment:                                                DATE: 10/28/2022 Therapeutic Exercise: NuStep L6 x 29mn Hip & knee MMT Lumbar mobility testing Supine (moist heat on low back): 3-way HS stretch w/strap 2x30" each B Break the stick 3x10" B Figure 4 glute stretch 3x30" (R side, L leg propped on orange SB) HS curls legs on orange SB x10 Dead bug Thomas stretch (R) Re-test of lumbar forward & lateral flexion                                                                                                                        PATIENT EDUCATION:  Education details: VAeronautical engineer Person educated: Patient Education method: Explanation, Demonstration, Tactile cues, Verbal cues, and Handouts Education comprehension: verbalized understanding, returned demonstration, verbal cues required, tactile cues required, and needs further education  HOME EXERCISE PROGRAM: Access Code: [email or web address]: [email or web address] Date: 10/28/2022 Prepared by: KHelane Gunther Exercises - Supine Dead Bug with Leg Extension  - 1 x daily - 7 x weekly - 3 sets - 10 reps - Clamshell with Resistance  - 1 x daily - 7 x weekly - 3 sets - 10 reps - Sidelying Hip Abduction and Extension with Loop Band  - 1 x daily - 7 x weekly - 3 sets - 10 reps - Beginner Sidelying Leg Circle  - 1 x daily - 7 x weekly - 3 sets - 10 reps - Figure 4 Bridge  - 1 x daily - 7 x weekly - 3 sets - 10 reps - Wall Quarter Squat  - 2 x daily - 7 x weekly - 1-2 sets - 10 reps - 5-10 sec  hold - Modified Single-Leg Deadlift  - 1 x daily - 7 x weekly - 3 sets - 10 reps - Standing Row with Anchored Resistance  - 1 x daily - 7 x weekly - 3 sets - 10 reps - Shoulder Extension with Resistance  - 1 x daily - 7 x weekly - 3 sets - 10 reps - Standing Anti-Rotation Press with Anchored Resistance  - 1 x daily - 7 x weekly - 3 sets - 10 reps - Tandem Stance  - 1 x daily - 7 x weekly - 3 sets - 10 reps - Tandem Walking  - 1 x daily - 7 x weekly - 3 sets - 10 reps - Prone on Elbows Stretch  - 1 x daily - 7 x weekly - 3 sets - 10 reps - Seated Thoracic Lumbar Extension with Pectoralis Stretch  - 1 x daily - 7 x weekly - 1 sets - 10 reps - 5-10sec hold - Prone Quadriceps Stretch with Strap  - 2 x daily - 7 x weekly - 1 sets -  3 reps - 30 sec hold - Seated Figure 4 Piriformis Stretch  - 1 x daily - 7 x weekly - 3 sets - 10 reps - Standing Lumbar  Extension with Counter  - 1 x daily - 7 x weekly - 1 sets - 5 reps - 10-20 sec hold - Thomas Stretch  - 1 x daily - 7 x weekly - 3 sets - 10 reps  ASSESSMENT:  CLINICAL IMPRESSION: Patient demonstrated improved pelvic and core stabilization with side lying and supine exercises. Postural sway exhibited during resisted walking, with greater instability in eccentric phase of resisted walking. Patient able to complete all exercises with no exacerbation of symptoms.   OBJECTIVE IMPAIRMENTS: decreased activity tolerance, decreased balance, decreased endurance, decreased mobility, difficulty walking, decreased ROM, decreased strength, hypomobility, increased muscle spasms, impaired flexibility, improper body mechanics, postural dysfunction, and pain.   ACTIVITY LIMITATIONS: carrying, lifting, bending, sitting, standing, squatting, stairs, transfers, bed mobility, and locomotion level  PARTICIPATION LIMITATIONS: meal prep, cleaning, laundry, shopping, community activity, and yard work  PERSONAL FACTORS: Age, Fitness, Past/current experiences, Profession, Time since onset of injury/illness/exacerbation, and comorbidities: prior lumbar and cervical fusions; psoriatic arthritis; arthritis; HTN; sedentary lifestyle are also affecting patient's functional outcome.   REHAB POTENTIAL: Good  CLINICAL DECISION MAKING: Stable/uncomplicated  EVALUATION COMPLEXITY: Low   GOALS: Goals reviewed with patient? Yes  SHORT TERM GOALS: Target date: 09/30/2022  Patient independent in initial HEP  Baseline: Goal status: MET on 09/28/22  2.  Patient demonstrates and reports improved ability to move from sit to stand and stand to sit  Baseline:  Goal status: MET on 09/28/22    LONG TERM GOALS: Target date: 12/09/2022  Increase lumbar mobility and ROM to WFL's with minimal to no pain  Baseline:  Goal status: In PROGRESS (sharp pain on R side with lateral flexion to L) on 10/28/22  2.  Increase strength bilat  LE's to 4+/5 to 5/5 throughout Baseline:  Goal status: In PROGRESS (regression in R hip abd strength) on 10/28/22  3.  Decrease pain Rt LB Lt hip/groin 75% allowing patient to return to more normal functional activities  Baseline:  Goal status: IN PROGRESS (65% improvement) on 10/28/22  4.  Patient demonstrates and reports independent and safe transfers from sit to stand and stand to sit as well as all bed mobility Baseline:  Goal status: MET on 10/26/22  5.  Independent in HEP including aquatic program as indicated  Baseline:  Goal status: MET on 10/26/22  6.  Improve functional limitation score to 57 Baseline: 42 Goal status: MET (72) on 10/26/22  PLAN:  PT FREQUENCY: 2x/week  PT DURATION: 8 weeks  PLANNED INTERVENTIONS: Therapeutic exercises, Therapeutic activity, Neuromuscular re-education, Balance training, Gait training, Patient/Family education, Self Care, Joint mobilization, Aquatic Therapy, Dry Needling, Electrical stimulation, Cryotherapy, Moist heat, Taping, Ultrasound, Ionotophoresis '4mg'$ /ml Dexamethasone, Manual therapy, and Re-evaluation.  PLAN FOR NEXT SESSION: Glute & hip/posterior chain strengthening; progress core stabilization   Helane Gunther, PTA 11/02/2022 10:01

## 2022-11-04 ENCOUNTER — Ambulatory Visit: Payer: Medicare Other

## 2022-11-04 DIAGNOSIS — M4316 Spondylolisthesis, lumbar region: Secondary | ICD-10-CM

## 2022-11-04 DIAGNOSIS — M6281 Muscle weakness (generalized): Secondary | ICD-10-CM

## 2022-11-04 DIAGNOSIS — M5459 Other low back pain: Secondary | ICD-10-CM

## 2022-11-04 DIAGNOSIS — R29898 Other symptoms and signs involving the musculoskeletal system: Secondary | ICD-10-CM

## 2022-11-04 NOTE — Therapy (Signed)
OUTPATIENT PHYSICAL THERAPY THORACOLUMBAR TREATMENT   Patient Name: Margaret Moody MRN: [id number] DOB:Nov 13, 1950, 72 y.o., female Today's Date: 11/04/2022  END OF SESSION:  PT End of Session - 11/04/22 0843     Visit Number 14    Number of Visits 22    Date for PT Re-Evaluation 12/09/22    PT Start Time 0845    PT Stop Time 0927    PT Time Calculation (min) 42 min    Activity Tolerance Patient tolerated treatment well    Behavior During Therapy [facility] for tasks assessed/performed             Past Medical History:  Diagnosis Date   Anemia    Arthritis    Cancer ([place]) 2010   Breast   Diabetes mellitus without complication ([place])    Fatty liver    Fibromyalgia    GERD (gastroesophageal reflux disease)    Headache    migraines - takes Atenolol to prevent    Heart murmur    states she has not had an echo nor does she see a cardiologist   Past Surgical History:  Procedure Laterality Date   APPENDECTOMY  1972   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   EYE SURGERY Bilateral 2017   cataracts   MASTECTOMY Bilateral 2010   TONSILLECTOMY     removed when a child   [place]   Patient Active Problem List   Diagnosis Date Noted   S/P lumbar fusion 09/07/2019   Cervical spondylosis without myelopathy 04/10/2018   Depression with anxiety 03/07/2014   Malignant neoplasm of upper-outer quadrant of right female breast ([place]) 08/09/2011   Depression 07/19/2011    PCP: Dr Elenor Quinones  REFERRING PROVIDER: Dr Eustace Moore   REFERRING DIAG: Lumbar Spondylolisthesis  Rationale for Evaluation and Treatment: Rehabilitation  THERAPY DIAG:  Spondylolisthesis of lumbar region  Other low back pain  Other symptoms and signs involving the musculoskeletal system  Muscle weakness (generalized)  ONSET DATE: 01/28/22  SUBJECTIVE:                                                                                                                                                                                            SUBJECTIVE STATEMENT: Patient reports she continues to feel good with no pain.    PERTINENT HISTORY:  Lumbar and cervical spine surgeries; cervical surgery ~ 5 years ago due to neck pain with pain into both arms - good resolution of neck symptoms; lumbar fusion 2020 with "cage";  fall with fracture Lt wrist, scapula and dislocation of Lt GH joint ~  1 year ago; ADOM; HTN; arthritis; psoriatic arthritis; osteoporosis   PAIN:  Are you having pain? Yes: NPRS scale: 0/10 Pain location: low back  Pain description: aching Aggravating factors: could be anything - worst moving sit >< stand; bending; getting in and out of car Relieving factors: time; lying down; medication   PRECAUTIONS: None  WEIGHT BEARING RESTRICTIONS: No  FALLS:  Has patient fallen in last 6 months? 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 Posterior and interbody fusion at the L5-S1 level. No findings to suggest canal stenosis. At least moderate right and mild left foraminal stenosis at this level. 2. Mild disc bulge at L4-L5 with possible mild left foraminal stenosis. 3. Aortic atherosclerosis (ICD10-I70.0).  PATIENT SURVEYS:  FOTO 42; goal 57 10/08/22: 63 10/26/22: 72 11/04/22: 70   MUSCLE LENGTH: Hamstrings: Right 65 deg; Left 55 deg  POSTURE: rounded shoulders, forward head, decreased lumbar lordosis, increased thoracic kyphosis, and flexed trunk   PALPATION: Muscular tightness Lt posterior hip through gluts, piriformis   LUMBAR ROM:   AROM eval 10/28/22  Flexion 65% pulling  70%   Extension  20% 20%  Right lateral flexion 65% 70%  Left lateral flexion 50% discomfort Lt LB  65% R side sharp pain  Right rotation 20% 20%  Left rotation 15% 20%   (Blank rows = not tested)  LOWER EXTREMITY MMT:    MMT Right eval Left eval Right  10/28/22 Left 10/28/22   Hip flexion 4+ 4+ 4 4+  Hip extension 4- 4 4- 4-  Hip abduction 4 4+ 4- 4+  Hip adduction      Hip internal rotation      Hip external rotation      Knee flexion 5 5    Knee extension 4+ 4+ 4+ 4+  Ankle dorsiflexion      Ankle plantarflexion      Ankle inversion      Ankle eversion       (Blank rows = not tested)  LUMBAR SPECIAL TESTS:  Straight leg raise test: Negative and Slump test: Negative  FUNCTIONAL TESTS:  From Eval: Unable to achieve SLS without UE support  09/07/22: STSx5 - 35 sec (hands braced on thighs)  GAIT: Distance walked: 40 Assistive device utilized: None Level of assistance: Complete Independence Comments: wide based support with decreased stance phase Lt LE; decreased balance    TODAY'S TREATMENT:   [facility] Adult PT Treatment:                                                DATE: 11/04/2022 Therapeutic Exercise: Treadmill warm-up: 1.5 mph, 0% incline x 10 min + subjective intake Seated SB roll out stretch S/L straight leg hip add (ball squeeze) & hip abd (yellow loop band at ankles) x10 B S/L clam + kick x10 B Seated thoracic extension stretch 6x10" S/L resisted hip abd + extension RTB x10 B Figure 4 bridge x5 B    [facility] Adult PT Treatment:  DATE: 11/02/2022 Therapeutic Exercise: NuStep L7 x 5 min Deadbug x15 S/L straight leg hip add (ball squeeze) & hip abd (RTB at ankles) Figure 4 glute stretch x30" B DKTC rolling OSB x10 Straight leg bridges on OSB x10 Thomas stretch x30" B Resisted walking (pulleys, 5#): fwd/bkwd/side stepping (close SBA) Tandem static balance x30" B --> added slow R/L head turns --> slow head nods up                                                                                                                        PATIENT EDUCATION:  Education details: walking program  Person educated: Patient Education method: Explanation, Demonstration, Tactile cues, Verbal cues, and Handouts Education comprehension: verbalized understanding, returned demonstration, verbal cues required, tactile cues required, and needs further education  HOME EXERCISE PROGRAM: Access Code: [email or web address] URL: [email or web address] Date: 11/04/2022 Prepared by: Helane Gunther  Exercises - Supine Dead Bug with Leg Extension  - 1 x daily - 7 x weekly - 3 sets - 10 reps - Clamshell with Resistance  - 1 x daily - 7 x weekly - 3 sets - 10 reps - Sidelying Hip Abduction and Extension with Loop Band  - 1 x daily - 7 x weekly - 3 sets - 10 reps - Figure 4 Bridge  - 1 x daily - 7 x weekly - 3 sets - 10 reps - Wall Quarter Squat  - 2 x daily - 7 x weekly - 1-2 sets - 10 reps - 5-10 sec  hold - Modified Single-Leg Deadlift  - 1 x daily - 7 x weekly - 3 sets - 10 reps - Standing Row with Anchored Resistance  - 1 x daily - 7 x weekly - 3 sets - 10 reps - Shoulder Extension with Resistance  - 1 x daily - 7 x weekly - 3 sets - 10 reps - Standing Anti-Rotation Press with Anchored Resistance  - 1 x daily - 7 x weekly - 3 sets - 10 reps - Prone on Elbows Stretch  - 1 x daily - 7 x weekly - 3 sets - 10 reps - Seated Thoracic Lumbar Extension with Pectoralis Stretch  - 1 x daily - 7 x weekly - 1 sets - 10 reps - 5-10sec hold - Prone Quadriceps Stretch with Strap  - 2 x daily - 7 x weekly - 1 sets - 3 reps - 30 sec hold - Seated Figure 4 Piriformis Stretch  - 1 x daily - 7 x weekly - 3 sets - 10 reps - Thomas Stretch  - 1 x daily - 7 x weekly - 3 sets - 10 reps  ASSESSMENT:  CLINICAL IMPRESSION:  Discussion with patient in establishing a walking program to progress endurance training and daily exercising. HEP updated to prepare patient for  upcoming discharge. Tactile cues provided to improve postural alignment and form during side lying clamshell exercise variations.     OBJECTIVE IMPAIRMENTS: decreased activity tolerance, decreased balance, decreased endurance, decreased  mobility, difficulty walking, decreased ROM, decreased strength, hypomobility, increased muscle spasms, impaired flexibility, improper body mechanics, postural dysfunction, and pain.   ACTIVITY LIMITATIONS: carrying, lifting, bending, sitting, standing, squatting, stairs, transfers, bed mobility, and locomotion level  PARTICIPATION LIMITATIONS: meal prep, cleaning, laundry, shopping, community activity, and yard work  PERSONAL FACTORS: Age, Fitness, Past/current experiences, Profession, Time since onset of injury/illness/exacerbation, and comorbidities: prior lumbar and cervical fusions; psoriatic arthritis; arthritis; HTN; sedentary lifestyle are also affecting patient's functional outcome.   REHAB POTENTIAL: Good  CLINICAL DECISION MAKING: Stable/uncomplicated  EVALUATION COMPLEXITY: Low   GOALS: Goals reviewed with patient? Yes  SHORT TERM GOALS: Target date: 09/30/2022  Patient independent in initial HEP  Baseline: Goal status: MET on 09/28/22  2.  Patient demonstrates and reports improved ability to move from sit to stand and stand to sit  Baseline:  Goal status: MET on 09/28/22    LONG TERM GOALS: Target date: 12/09/2022  Increase lumbar mobility and ROM to WFL's with minimal to no pain  Baseline:  Goal status: In PROGRESS (sharp pain on R side with lateral flexion to L) on 10/28/22  2.  Increase strength bilat LE's to 4+/5 to 5/5 throughout Baseline:  Goal status: In PROGRESS (regression in R hip abd strength) on 10/28/22  3.  Decrease pain Rt LB Lt hip/groin 75% allowing patient to return to more normal functional activities  Baseline:  Goal status: IN PROGRESS (65% improvement) on 10/28/22  4.  Patient demonstrates and reports  independent and safe transfers from sit to stand and stand to sit as well as all bed mobility Baseline:  Goal status: MET on 10/26/22  5.  Independent in HEP including aquatic program as indicated  Baseline:  Goal status: MET on 10/26/22  6.  Improve functional limitation score to 57 Baseline: 42 Goal status: MET (72) on 10/26/22  PLAN:  PT FREQUENCY: 2x/week  PT DURATION: 8 weeks  PLANNED INTERVENTIONS: Therapeutic exercises, Therapeutic activity, Neuromuscular re-education, Balance training, Gait training, Patient/Family education, Self Care, Joint mobilization, Aquatic Therapy, Dry Needling, Electrical stimulation, Cryotherapy, Moist heat, Taping, Ultrasound, Ionotophoresis '4mg'$ /ml Dexamethasone, Manual therapy, and Re-evaluation.  PLAN FOR NEXT SESSION: Review LGT; Glute & hip/posterior chain strengthening; progress core stabilization   Helane Gunther, PTA 11/04/2022 10:01

## 2022-11-09 ENCOUNTER — Ambulatory Visit: Payer: Medicare Other

## 2022-11-09 DIAGNOSIS — R29898 Other symptoms and signs involving the musculoskeletal system: Secondary | ICD-10-CM

## 2022-11-09 DIAGNOSIS — M4316 Spondylolisthesis, lumbar region: Secondary | ICD-10-CM

## 2022-11-09 DIAGNOSIS — M6281 Muscle weakness (generalized): Secondary | ICD-10-CM

## 2022-11-09 DIAGNOSIS — M5459 Other low back pain: Secondary | ICD-10-CM

## 2022-11-09 NOTE — Therapy (Addendum)
OUTPATIENT PHYSICAL THERAPY THORACOLUMBAR TREATMENT AND DISCHARGE SUMMARY  PHYSICAL THERAPY DISCHARGE SUMMARY  Visits from Start of Care: 15  Current functional level related to goals / functional outcomes: SEE PROGRESS NOTE FOR DISCHARGE STATUS   Remaining deficits: Needs to continue with consistent HEP    Education / Equipment: HEP    Patient agrees to discharge. Patient goals were met. Patient is being discharged due to meeting the stated rehab goals.  Celyn P. Helene Kelp PT, MPH 11/09/22 11:30 AM    Patient Name: Margaret Moody MRN: [id number] DOB:08-08-1951, 72 y.o., female Today's Date: 11/09/2022  END OF SESSION:  PT End of Session - 11/09/22 0927     Visit Number 15    Number of Visits 22    Date for PT Re-Evaluation 12/09/22    PT Start Time 0928    PT Stop Time 1017    PT Time Calculation (min) 49 min    Activity Tolerance Patient tolerated treatment well    Behavior During Therapy [facility] for tasks assessed/performed             Past Medical History:  Diagnosis Date   Anemia    Arthritis    Cancer ([place]) 2010   Breast   Diabetes mellitus without complication ([place])    Fatty liver    Fibromyalgia    GERD (gastroesophageal reflux disease)    Headache    migraines - takes Atenolol to prevent    Heart murmur    states she has not had an echo nor does she see a cardiologist   Past Surgical History:  Procedure Laterality Date   APPENDECTOMY  1972   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   EYE SURGERY Bilateral 2017   cataracts   MASTECTOMY Bilateral 2010   TONSILLECTOMY     removed when a child   [place]   Patient Active Problem List   Diagnosis Date Noted   S/P lumbar fusion 09/07/2019   Cervical spondylosis without myelopathy 04/10/2018   Depression with anxiety 03/07/2014   Malignant neoplasm of upper-outer quadrant of right female breast ([place]) 08/09/2011   Depression 07/19/2011    PCP: Dr Elenor Quinones  REFERRING PROVIDER:  Dr Eustace Moore   REFERRING DIAG: Lumbar Spondylolisthesis  Rationale for Evaluation and Treatment: Rehabilitation  THERAPY DIAG:  Spondylolisthesis of lumbar region  Other low back pain  Other symptoms and signs involving the musculoskeletal system  Muscle weakness (generalized)  ONSET DATE: 01/28/22  SUBJECTIVE:  SUBJECTIVE STATEMENT: Patient reports she continues to be pain free since her travels. Patient states she is compliant with HEP and feels she has a good mix of LE/postural strengthening exercises. Patient states her balance has also improved.    PERTINENT HISTORY:  Lumbar and cervical spine surgeries; cervical surgery ~ 5 years ago due to neck pain with pain into both arms - good resolution of neck symptoms; lumbar fusion 2020 with "cage";  fall with fracture Lt wrist, scapula and dislocation of Lt GH joint ~ 1 year ago; ADOM; HTN; arthritis; psoriatic arthritis; osteoporosis   PAIN:  Are you having pain? Yes: NPRS scale: 0/10 Pain location: low back  Pain description: aching Aggravating factors: could be anything - worst moving sit >< stand; bending; getting in and out of car Relieving factors: time; lying down; medication   PRECAUTIONS: None  WEIGHT BEARING RESTRICTIONS: No  FALLS:  Has patient fallen in last 6 months? 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 Posterior and interbody fusion at the L5-S1 level. No  findings to suggest canal stenosis. At least moderate right and mild left foraminal stenosis at this level. 2. Mild disc bulge at L4-L5 with possible mild left foraminal stenosis. 3. Aortic atherosclerosis (ICD10-I70.0).  PATIENT SURVEYS:  FOTO 42; goal 57 10/08/22: 63 10/26/22: 72 11/04/22: 70   MUSCLE LENGTH: Hamstrings: Right 65 deg; Left 55 deg  POSTURE: rounded shoulders, forward head, decreased lumbar lordosis, increased thoracic kyphosis, and flexed trunk   PALPATION: Muscular tightness Lt posterior hip through gluts, piriformis   LUMBAR ROM:   AROM eval 10/28/22 11/09/22  Flexion 65% pulling  70%  90% No pain  Extension 20% 20% 30% No pain  Right lateral flexion 65% 70% 70% No pain  Left lateral flexion 50% discomfort Lt LB  65% R side sharp pain 70% No pain  Right rotation 20% 20% 50% No pain  Left rotation 15% 20% 50% No pain   (Blank rows = not tested)  LOWER EXTREMITY MMT:    MMT Right eval Left eval Right  10/28/22 Left 10/28/22  R/L 11/09/22  Hip flexion 4+ 4+ 4 4+ 4+/4+  Hip extension 4- 4 4- 4- 4+/4  Hip abduction 4 4+ 4- 4+ 5/5  Hip adduction       Hip internal rotation       Hip external rotation       Knee flexion 5 5   5/5  Knee extension 4+ 4+ 4+ 4+ 5/5  Ankle dorsiflexion       Ankle plantarflexion       Ankle inversion       Ankle eversion        (Blank rows = not tested)  LUMBAR SPECIAL TESTS:  Straight leg raise test: Negative and Slump test: Negative  FUNCTIONAL TESTS:  From Eval: Unable to achieve SLS without UE support  09/07/22: STSx5 - 35 sec (hands braced on thighs)  GAIT: Distance walked: 40 Assistive device utilized: None Level of assistance: Complete Independence Comments: wide based support with decreased stance phase Lt LE; decreased balance    TODAY'S TREATMENT:   [facility] Adult  PT Treatment:                                                DATE: 11/09/2022 Therapeutic Exercise: NuStep L6 x 103mn + subjective  intake Hip/knee MMT Lumbar mobility testing Wall squats + ball squeeze x10 Deadlift 10#KB x10  Walking dead lift 5#DB farmer carry --> modified single leg dead lift 5#DB farmer carry Rows: narrow (BTB) & wide (GTB) x10 each Isometric paloff press x10 B (GTB) Stride stance paloff press + overhead lift GTB x10 (B) (Pulleys) resisted walking: fwd/bkwd 10#, sides 5#    [facility] Adult PT Treatment:                                                DATE: 11/04/2022 Therapeutic Exercise: Treadmill warm-up: 1.5 mph, 0% incline x 10 min + subjective intake Seated SB roll out stretch S/L straight leg hip add (ball squeeze) & hip abd (yellow loop band at ankles) x10 B S/L clam + kick x10 B Seated thoracic extension stretch 6x10" S/L resisted hip abd + extension RTB x10 B Figure 4 bridge x5 B                                                                                                                       PATIENT EDUCATION:  Education details: walking program  Person educated: Patient Education method: Explanation, Demonstration, Tactile cues, Verbal cues, and Handouts Education comprehension: verbalized understanding, returned demonstration, verbal cues required, tactile cues required, and needs further education  HOME EXERCISE PROGRAM: Access Code: [email or web address]: [email or web address] Date: 11/04/2022 Prepared by: KHelane Gunther Exercises - Supine Dead Bug with Leg Extension  - 1 x daily - 7 x weekly - 3 sets - 10 reps - Clamshell with Resistance  - 1 x daily - 7 x weekly - 3 sets - 10 reps - Sidelying Hip Abduction and Extension with Loop Band  - 1 x daily - 7 x weekly - 3 sets - 10 reps - Figure 4 Bridge  - 1 x daily - 7 x weekly - 3 sets - 10 reps - Wall Quarter Squat  - 2 x daily - 7 x weekly - 1-2 sets - 10 reps - 5-10 sec  hold - Modified Single-Leg Deadlift  - 1 x daily - 7 x weekly - 3 sets - 10 reps - Standing Row with Anchored Resistance  - 1 x daily - 7 x weekly - 3  sets - 10 reps - Shoulder Extension with Resistance  - 1 x daily - 7 x weekly - 3 sets - 10 reps - Standing Anti-Rotation Press with Anchored Resistance  - 1 x daily - 7 x weekly - 3 sets -  10 reps - Prone on Elbows Stretch  - 1 x daily - 7 x weekly - 3 sets - 10 reps - Seated Thoracic Lumbar Extension with Pectoralis Stretch  - 1 x daily - 7 x weekly - 1 sets - 10 reps - 5-10sec hold - Prone Quadriceps Stretch with Strap  - 2 x daily - 7 x weekly - 1 sets - 3 reps - 30 sec hold - Seated Figure 4 Piriformis Stretch  - 1 x daily - 7 x weekly - 3 sets - 10 reps - Thomas Stretch  - 1 x daily - 7 x weekly - 3 sets - 10 reps  ASSESSMENT:  CLINICAL IMPRESSION:  Patient demonstrated greatly improved lumbar mobility with no pain in all directions. Bilateral hip strength improved as demonstrated by MMT; greatest improvements exhibited with R hip abd/flexion and bilateral hip extension. Patient reported 80% improvement in low back/hip pain, indicating good progress in decreased subjective pain symptoms and improved mobility. Improved hip hinge mechanics and postural alignment demonstrated during dead lift variations. Patient is in agreement to discharge from PT and continue with HEP for maintenance and establish a daily walking program.    OBJECTIVE IMPAIRMENTS: decreased activity tolerance, decreased balance, decreased endurance, decreased mobility, difficulty walking, decreased ROM, decreased strength, hypomobility, increased muscle spasms, impaired flexibility, improper body mechanics, postural dysfunction, and pain.   ACTIVITY LIMITATIONS: carrying, lifting, bending, sitting, standing, squatting, stairs, transfers, bed mobility, and locomotion level  PARTICIPATION LIMITATIONS: meal prep, cleaning, laundry, shopping, community activity, and yard work  PERSONAL FACTORS: Age, Fitness, Past/current experiences, Profession, Time since onset of injury/illness/exacerbation, and comorbidities: prior lumbar  and cervical fusions; psoriatic arthritis; arthritis; HTN; sedentary lifestyle are also affecting patient's functional outcome.   REHAB POTENTIAL: Good  CLINICAL DECISION MAKING: Stable/uncomplicated  EVALUATION COMPLEXITY: Low   GOALS: Goals reviewed with patient? Yes  SHORT TERM GOALS: Target date: 09/30/2022  Patient independent in initial HEP  Baseline: Goal status: MET on 09/28/22  2.  Patient demonstrates and reports improved ability to move from sit to stand and stand to sit  Baseline:  Goal status: MET on 09/28/22    LONG TERM GOALS: Target date: 12/09/2022  Increase lumbar mobility and ROM to WFL's with minimal to no pain  Baseline:  Goal status: MET (see above) on 11/09/22  2.  Increase strength bilat LE's to 4+/5 to 5/5 throughout Baseline:  Goal status: MET (see above) on 11/09/22  3.  Decrease pain Rt LB Lt hip/groin 75% allowing patient to return to more normal functional activities  Baseline:  Goal status: MET (80% improvement) on 11/09/22  4.  Patient demonstrates and reports independent and safe transfers from sit to stand and stand to sit as well as all bed mobility Baseline:  Goal status: MET on 10/26/22  5.  Independent in HEP including aquatic program as indicated  Baseline:  Goal status: MET on 10/26/22  6.  Improve functional limitation score to 57 Baseline: 42 Goal status: MET (72) on 10/26/22  PLAN:  PT FREQUENCY: 2x/week  PT DURATION: 8 weeks  PLANNED INTERVENTIONS: Therapeutic exercises, Therapeutic activity, Neuromuscular re-education, Balance training, Gait training, Patient/Family education, Self Care, Joint mobilization, Aquatic Therapy, Dry Needling, Electrical stimulation, Cryotherapy, Moist heat, Taping, Ultrasound, Ionotophoresis '4mg'$ /ml Dexamethasone, Manual therapy, and Re-evaluation.  PLAN FOR NEXT SESSION: Discharge  Helane Gunther, PTA 11/09/2022 10:01

## 2024-03-22 ENCOUNTER — Ambulatory Visit (INDEPENDENT_AMBULATORY_CARE_PROVIDER_SITE_OTHER): Admitting: Podiatry

## 2024-03-22 ENCOUNTER — Encounter: Payer: Self-pay | Admitting: Podiatry

## 2024-03-22 DIAGNOSIS — E1142 Type 2 diabetes mellitus with diabetic polyneuropathy: Secondary | ICD-10-CM

## 2024-03-22 DIAGNOSIS — L600 Ingrowing nail: Secondary | ICD-10-CM | POA: Diagnosis not present

## 2024-03-22 NOTE — Progress Notes (Addendum)
 Subjective:  Patient ID: Margaret Moody, female    DOB: [birth year],   MRN: [id number]  Chief Complaint  Patient presents with   Diabetes    My toes hurt.  The big toenails got thick.  They hurt on the top.  Saw Dr. Cindie - in 02/09/2024 A1c - 5.6    73 y.o. female presents for concern of painful great toenails. Relates they have chronically been painful and she has tried removal in the past but came back the same way. She gets them trimmed and helps for a while but pain returns. She would like to consider removal today. She is diabetic last A1c was  Lab Results  Component Value Date   HGBA1C 7.0 (H) 08/14/2019   .   PCP:  Clarance Joen HERO, MD     PCP:  Clarance Joen HERO, MD    . Denies any other pedal complaints. Denies n/v/f/c.   Past Medical History:  Diagnosis Date   Anemia    Arthritis    Cancer (HCC) 2010   Breast   Diabetes mellitus without complication (HCC)    Fatty liver    Fibromyalgia    GERD (gastroesophageal reflux disease)    Headache    migraines - takes Atenolol  to prevent    Heart murmur    states she has not had an echo nor does she see a cardiologist    Objective:  Physical Exam: Vascular: DP/PT pulses 2/4 bilateral. CFT <3 seconds. Absent hair growth on digits. Edema noted to bilateral lower extremities. Xerosis noted bilaterally.  Skin. No lacerations or abrasions bilateral feet. Nails 1-5 bilateral  are discolored and thickened. Bilateral hallux nails thickened and dystrophic. Painful to palpation.  Musculoskeletal: MMT 5/5 bilateral lower extremities in DF, PF, Inversion and Eversion. Deceased ROM in DF of ankle joint.  Neurological: Sensation intact to light touch. Protective sensation intact bilateral.    Assessment:   1. Ingrown left greater toenail   2. Type 2 diabetes mellitus with peripheral neuropathy (HCC)   3. Ingrown right greater toenail      Plan:  Patient was evaluated and treated and all questions  answered. Discussed ingrown toenails etiology and treatment options including procedure for removal vs conservative care.  Patient requesting removal of ingrown nail today. Procedure below.  Discussed procedure and post procedure care and patient expressed understanding.  Will follow-up in 2 weeks for nail check or sooner if any problems arise.    Procedure:  Procedure: total Nail Avulsion of bilaterally hallux  Surgeon: Asberry Failing, DPM  Pre-op Dx: Ingrown toenail without infection Post-op: Same  Place of Surgery: Office exam room.  Indications for surgery: Painful and ingrown toenail.    The patient is requesting removal of nail with  chemical matrixectomy. Risks and complications were discussed with the patient for which they understand and written consent was obtained. Under sterile conditions a total of 3 mL of  1% lidocaine  plain was infiltrated in a hallux block fashion. Once anesthetized, the skin was prepped in sterile fashion. A tourniquet was then applied. Next the entire hallux nail was removed bilateral   Next phenol was then applied under standard conditions to permanently destroy the matrix and copiously irrigated. Silvadene was applied. A dry sterile dressing was applied. After application of the dressing the tourniquet was removed and there is found to be an immediate capillary refill time to the digit. The patient tolerated the procedure well without any complications. Post procedure instructions were discussed the patient for  which he verbally understood. Follow-up in two weeks for nail check or sooner if any problems are to arise. Discussed signs/symptoms of infection and directed to call the office immediately should any occur or go directly to the emergency room. In the meantime, encouraged to call the office with any questions, concerns, changes symptoms.    Asberry Failing, DPM

## 2024-03-22 NOTE — Patient Instructions (Signed)

## 2024-04-06 ENCOUNTER — Ambulatory Visit: Admitting: Podiatry

## 2024-04-06 ENCOUNTER — Encounter: Payer: Self-pay | Admitting: Podiatry

## 2024-04-06 DIAGNOSIS — L600 Ingrowing nail: Secondary | ICD-10-CM

## 2024-04-06 DIAGNOSIS — E1142 Type 2 diabetes mellitus with diabetic polyneuropathy: Secondary | ICD-10-CM

## 2024-04-06 NOTE — Progress Notes (Signed)
  Subjective:  Patient ID: Margaret Moody, female    DOB: [birth year],   MRN: [id number]  Chief Complaint  Patient presents with   Nail Problem    They're doing good.  The hurt a lot less than they used to.  My pain level is a one.    73 y.o. female presents for follow-up of bilateral hallux nail avulsions. Relates doing well and has been soaking as instructed . Denies any other pedal complaints. Denies n/v/f/c.   Past Medical History:  Diagnosis Date   Anemia    Arthritis    Cancer (HCC) 2010   Breast   Diabetes mellitus without complication (HCC)    Fatty liver    Fibromyalgia    GERD (gastroesophageal reflux disease)    Headache    migraines - takes Atenolol  to prevent    Heart murmur    states she has not had an echo nor does she see a cardiologist    Objective:  Physical Exam: Vascular: DP/PT pulses 2/4 bilateral. CFT <3 seconds. Normal hair growth on digits. No edema.  Skin. No lacerations or abrasions bilateral feet. Bilateral hallux nails healing well.  Musculoskeletal: MMT 5/5 bilateral lower extremities in DF, PF, Inversion and Eversion. Deceased ROM in DF of ankle joint.  Neurological: Sensation intact to light touch.   Assessment:   1. Ingrown right greater toenail   2. Type 2 diabetes mellitus with peripheral neuropathy (HCC)      Plan:  Patient was evaluated and treated and all questions answered. Toe was evaluated and appears to be healing well.  May discontinue soaks and neosporin.  Patient to follow-up as needed.     Asberry Failing, DPM
